# Patient Record
Sex: Male | Born: 1935 | Race: Black or African American | Hispanic: No | Smoking: Never smoker
Health system: Southern US, Community
[De-identification: ages and names within clinical notes are randomized; demographics above are authoritative.]

## PROBLEM LIST (undated history)

## (undated) DIAGNOSIS — E119 Type 2 diabetes mellitus without complications: Secondary | ICD-10-CM

## (undated) DIAGNOSIS — E785 Hyperlipidemia, unspecified: Secondary | ICD-10-CM

## (undated) DIAGNOSIS — I10 Essential (primary) hypertension: Secondary | ICD-10-CM

## (undated) DIAGNOSIS — K219 Gastro-esophageal reflux disease without esophagitis: Secondary | ICD-10-CM

---

## 2000-01-25 ENCOUNTER — Encounter: Admission: RE | Admit: 2000-01-25 | Discharge: 2000-04-24 | Payer: Self-pay | Admitting: Radiation Oncology

## 2016-08-22 ENCOUNTER — Encounter (HOSPITAL_COMMUNITY): Payer: Self-pay | Admitting: Emergency Medicine

## 2016-08-22 ENCOUNTER — Emergency Department (HOSPITAL_COMMUNITY)
Admission: EM | Admit: 2016-08-22 | Discharge: 2016-08-22 | Disposition: A | Discharge status: Home / Self Care | Payer: Medicare Other | Attending: Emergency Medicine | Admitting: Emergency Medicine

## 2016-08-22 DIAGNOSIS — N390 Urinary tract infection, site not specified: Secondary | ICD-10-CM | POA: Diagnosis not present

## 2016-08-22 DIAGNOSIS — R300 Dysuria: Secondary | ICD-10-CM | POA: Diagnosis present

## 2016-08-22 DIAGNOSIS — Z7984 Long term (current) use of oral hypoglycemic drugs: Secondary | ICD-10-CM | POA: Diagnosis not present

## 2016-08-22 DIAGNOSIS — I10 Essential (primary) hypertension: Secondary | ICD-10-CM | POA: Diagnosis not present

## 2016-08-22 DIAGNOSIS — Z79899 Other long term (current) drug therapy: Secondary | ICD-10-CM | POA: Diagnosis not present

## 2016-08-22 HISTORY — DX: Essential (primary) hypertension: I10

## 2016-08-22 LAB — URINALYSIS, ROUTINE W REFLEX MICROSCOPIC
Glucose, UA: NEGATIVE mg/dL
Ketones, ur: 15 mg/dL — AB
Nitrite: NEGATIVE
Protein, ur: 100 mg/dL — AB
Specific Gravity, Urine: >1.03 — ABNORMAL HIGH (ref 1.005–1.030)
pH: 5.5 (ref 5.0–8.0)

## 2016-08-22 LAB — CBC WITH DIFFERENTIAL/PLATELET
Basophils Absolute: 0 10*3/uL (ref 0.0–0.1)
Basophils Relative: 0 %
Eosinophils Absolute: 0 10*3/uL (ref 0.0–0.7)
Eosinophils Relative: 0 %
HCT: 29.8 % — ABNORMAL LOW (ref 39.0–52.0)
Hemoglobin: 9.8 g/dL — ABNORMAL LOW (ref 13.0–17.0)
Lymphocytes Relative: 10 %
Lymphs Abs: 1.6 10*3/uL (ref 0.7–4.0)
MCH: 26.6 pg (ref 26.0–34.0)
MCHC: 32.9 g/dL (ref 30.0–36.0)
MCV: 80.8 fL (ref 78.0–100.0)
Monocytes Absolute: 0.6 10*3/uL (ref 0.1–1.0)
Monocytes Relative: 4 %
Neutro Abs: 13.9 10*3/uL — ABNORMAL HIGH (ref 1.7–7.7)
Neutrophils Relative %: 86 %
Platelets: 161 10*3/uL (ref 150–400)
RBC: 3.69 MIL/uL — ABNORMAL LOW (ref 4.22–5.81)
RDW: 16.5 % — ABNORMAL HIGH (ref 11.5–15.5)
WBC: 16.1 10*3/uL — ABNORMAL HIGH (ref 4.0–10.5)

## 2016-08-22 LAB — URINALYSIS, MICROSCOPIC (REFLEX)

## 2016-08-22 LAB — BASIC METABOLIC PANEL
Anion gap: 14 (ref 5–15)
BUN: 46 mg/dL — ABNORMAL HIGH (ref 6–20)
CO2: 18 mmol/L — ABNORMAL LOW (ref 22–32)
Calcium: 8.8 mg/dL — ABNORMAL LOW (ref 8.9–10.3)
Chloride: 107 mmol/L (ref 101–111)
Creatinine, Ser: 2.3 mg/dL — ABNORMAL HIGH (ref 0.61–1.24)
GFR calc Af Amer: 27 mL/min — ABNORMAL LOW (ref >60)
GFR calc non Af Amer: 23 mL/min — ABNORMAL LOW (ref >60)
Glucose, Bld: 110 mg/dL — ABNORMAL HIGH (ref 65–99)
Potassium: 4.3 mmol/L (ref 3.5–5.1)
Sodium: 139 mmol/L (ref 135–145)

## 2016-08-22 MED ORDER — DEXTROSE 5 % IV SOLN
1.0000 g | Freq: Once | INTRAVENOUS | Status: AC
  Administered 2016-08-22: 1 g via INTRAVENOUS
  Filled 2016-08-22: qty 10

## 2016-08-22 MED ORDER — CEPHALEXIN 500 MG PO CAPS: 500.0000 mg | ORAL_CAPSULE | Freq: Once | ORAL | Status: DC

## 2016-08-22 MED ORDER — CEPHALEXIN 500 MG PO CAPS: 500.0000 mg | ORAL_CAPSULE | Freq: Three times a day (TID) | ORAL | 0 refills | Status: DC

## 2016-08-22 MED ORDER — SODIUM CHLORIDE 0.9 % IV BOLUS (SEPSIS)
1000.0000 mL | Freq: Once | INTRAVENOUS | Status: AC
  Administered 2016-08-22: 1000 mL via INTRAVENOUS

## 2016-08-22 NOTE — ED Triage Notes (Signed)
Pt reports dysuria and decreased urinary output for last several days. Pt denies any known fever, abd pain, n/v/d.

## 2016-08-22 NOTE — ED Provider Notes (Signed)
[facility] DEPT Provider Note   CSN: [id number] Arrival date & time: 08/22/16  1629     History   Chief Complaint Chief Complaint  Patient presents with  . Dysuria    HPI Robert Gates is a 83 y.o. male.  The history is provided by the patient.  Dysuria   This is a new problem. The current episode started more than 2 days ago. The problem occurs every urination. The problem has not changed since onset.The quality of the pain is described as burning. The pain is moderate. There has been no fever. Associated symptoms include chills, frequency, hematuria and urgency. Pertinent negatives include no sweats, no nausea, no vomiting, no discharge, no hesitancy, no possible pregnancy and no flank pain. He has tried nothing for the symptoms. His past medical history is significant for kidney stones.   83 year old male who presents with dysuria over the past 2-3 days. Has a history of nephrolithiasis. Onset of hematuria, urinary frequency, dysuria over the past few days. No associated fevers, chills, nausea or vomiting, diarrhea, abdominal pain or flank pain.   Past Medical History:  Diagnosis Date  . Hypertension     There are no active problems to display for this patient.   History reviewed. No pertinent surgical history.     Home Medications    Prior to Admission medications   Medication Sig Start Date End Date Taking? Authorizing Provider  AMLODIPINE BESYLATE PO Take 1 tablet by mouth daily.   Yes [provider]  LISINOPRIL PO Take 1 tablet by mouth daily.   Yes [provider]  metFORMIN (GLUCOPHAGE) 1000 MG tablet Take 1,000 mg by mouth 2 (two) times daily with a meal.   Yes [provider]  PRAVASTATIN SODIUM PO Take 0.5 tablets by mouth daily.   Yes [provider]  Vitamin D, Ergocalciferol, (DRISDOL) 50000 units CAPS capsule Take 50,000 Units by mouth every 7 (seven) days. wednesday   Yes [provider]  cephALEXin  (KEFLEX) 500 MG capsule Take 1 capsule (500 mg total) by mouth 3 (three) times daily. 08/22/16   Forde Dandy, MD    Family History History reviewed. No pertinent family history.  Social History Social History  Substance Use Topics  . Smoking status: Never Smoker  . Smokeless tobacco: Never Used  . Alcohol use Yes     Comment: occ     Allergies   Patient has no known allergies.   Review of Systems Review of Systems  Constitutional: Positive for chills. Negative for fever.  Gastrointestinal: Negative for nausea and vomiting.  Genitourinary: Positive for dysuria, frequency, hematuria and urgency. Negative for flank pain and hesitancy.  Musculoskeletal: Negative for back pain.  Allergic/Immunologic: Negative for immunocompromised state.  Hematological: Does not bruise/bleed easily.  Psychiatric/Behavioral: Negative for confusion.  All other systems reviewed and are negative.    Physical Exam Updated Vital Signs BP (!) 118/55   Pulse 90   Temp 98 F (36.7 C) (Oral)   Resp 18   Ht 5\' 9"  (1.753 m)   Wt 86.2 kg (190 lb)   SpO2 98%   BMI 28.06 kg/m   Physical Exam Physical Exam  Nursing note and vitals reviewed. Constitutional: Well developed, well nourished, non-toxic, and in no acute distress Head: Normocephalic and atraumatic.  Mouth/Throat: Oropharynx is clear and moist.  Neck: Normal range of motion. Neck supple.  Cardiovascular: Normal rate and regular rhythm.   Pulmonary/Chest: Effort normal and breath sounds normal.  Abdominal: Soft. There  is no tenderness. There is no rebound and no guarding.  no CVA tenderness  Musculoskeletal: Normal range of motion.  Neurological: Alert, no facial droop, fluent speech, moves all extremities symmetrically Skin: Skin is warm and dry.  Psychiatric: Cooperative   ED Treatments / Results  Labs (all labs ordered are listed, but only abnormal results are displayed) Labs Reviewed  URINALYSIS, ROUTINE W REFLEX MICROSCOPIC  - Abnormal; Notable for the following:       Result Value   APPearance HAZY (*)    Specific Gravity, Urine >1.030 (*)    Hgb urine dipstick LARGE (*)    Bilirubin Urine SMALL (*)    Ketones, ur 15 (*)    Protein, ur 100 (*)    Leukocytes, UA LARGE (*)    All other components within normal limits  CBC WITH DIFFERENTIAL/PLATELET - Abnormal; Notable for the following:    WBC 16.1 (*)    RBC 3.69 (*)    Hemoglobin 9.8 (*)    HCT 29.8 (*)    RDW 16.5 (*)    Neutro Abs 13.9 (*)    All other components within normal limits  BASIC METABOLIC PANEL - Abnormal; Notable for the following:    CO2 18 (*)    Glucose, Bld 110 (*)    BUN 46 (*)    Creatinine, Ser 2.30 (*)    Calcium 8.8 (*)    GFR calc non Af Amer 23 (*)    GFR calc Af Amer 27 (*)    All other components within normal limits  URINALYSIS, MICROSCOPIC (REFLEX) - Abnormal; Notable for the following:    Bacteria, UA MANY (*)    Squamous Epithelial / LPF 0-5 (*)    All other components within normal limits  URINE CULTURE    EKG  EKG Interpretation None       Radiology No results found.  Procedures Procedures (including critical care time)  Medications Ordered in ED Medications  cefTRIAXone (ROCEPHIN) 1 g in dextrose 5 % 50 mL IVPB (1 g Intravenous New Bag/Given 08/22/16 2023)  sodium chloride 0.9 % bolus 1,000 mL (1,000 mLs Intravenous New Bag/Given 08/22/16 2023)     Initial Impression / Assessment and Plan / ED Course  I have reviewed the triage vital signs and the nursing notes.  Pertinent labs & imaging results that were available during my care of the patient were reviewed by me and considered in my medical decision making (see chart for details).     With dysuria and urinary frequency for 2 days. Afebrile. Well appearing. Soft and benign abdomen. No CVA tenderness. UA suggestive of UTI. Clinically no signs of pyelonephritis. He does have leukocytosis of 16 and AKI creatine of 2. Given IVF and ceftriaxone  will in ED.  Given that he is well appearing, will treat as outpatient. His sons are present and they see him often to check in on him.  Discussed close PCP follow-up with recheck on creatine in 1 week. Discussed strict return instructions, including fever, confusion, vomiting, abdominal or back pain.  He expressed understanding of all discharge instructions and felt comfortable with the plan of care.   Final Clinical Impressions(s) / ED Diagnoses   Final diagnoses:  Lower urinary tract infectious disease    New Prescriptions New Prescriptions   CEPHALEXIN (KEFLEX) 500 MG CAPSULE    Take 1 capsule (500 mg total) by mouth 3 (three) times daily.     Forde Dandy, MD 08/22/16 2109

## 2016-08-22 NOTE — ED Notes (Signed)
Received report on pt, pt denies any complaints, update given, pending urine sample results from lab,

## 2016-08-22 NOTE — Discharge Instructions (Signed)
Please take antibiotics for a urinary tract infection. Drink plenty of fluids. Please see your PCP in 1 week for recheck on your kidney function. Please return without fail for worsening symptoms, including fever, vomiting, confusion, severe abdominal pain or back pain or any other symptoms concerning to you.

## 2016-08-22 NOTE — ED Notes (Signed)
Pt and family at bedside updated on plan of care,

## 2016-08-24 LAB — URINE CULTURE

## 2017-01-13 ENCOUNTER — Inpatient Hospital Stay (HOSPITAL_COMMUNITY)
Admission: EM | Admit: 2017-01-13 | Discharge: 2017-01-23 | DRG: 180 | Disposition: A | Discharge status: Skilled Nursing Facility | Payer: Medicare Other | Attending: Internal Medicine | Admitting: Internal Medicine

## 2017-01-13 ENCOUNTER — Emergency Department (HOSPITAL_COMMUNITY): Payer: Medicare Other

## 2017-01-13 ENCOUNTER — Encounter (HOSPITAL_COMMUNITY): Payer: Self-pay

## 2017-01-13 DIAGNOSIS — C3411 Malignant neoplasm of upper lobe, right bronchus or lung: Principal | ICD-10-CM | POA: Diagnosis present

## 2017-01-13 DIAGNOSIS — J9811 Atelectasis: Secondary | ICD-10-CM | POA: Diagnosis present

## 2017-01-13 DIAGNOSIS — Z87891 Personal history of nicotine dependence: Secondary | ICD-10-CM

## 2017-01-13 DIAGNOSIS — I10 Essential (primary) hypertension: Secondary | ICD-10-CM | POA: Diagnosis not present

## 2017-01-13 DIAGNOSIS — R229 Localized swelling, mass and lump, unspecified: Secondary | ICD-10-CM

## 2017-01-13 DIAGNOSIS — E119 Type 2 diabetes mellitus without complications: Secondary | ICD-10-CM | POA: Diagnosis not present

## 2017-01-13 DIAGNOSIS — IMO0002 Reserved for concepts with insufficient information to code with codable children: Secondary | ICD-10-CM

## 2017-01-13 DIAGNOSIS — E785 Hyperlipidemia, unspecified: Secondary | ICD-10-CM | POA: Diagnosis present

## 2017-01-13 DIAGNOSIS — J9600 Acute respiratory failure, unspecified whether with hypoxia or hypercapnia: Secondary | ICD-10-CM | POA: Diagnosis not present

## 2017-01-13 DIAGNOSIS — Z9889 Other specified postprocedural states: Secondary | ICD-10-CM

## 2017-01-13 DIAGNOSIS — J90 Pleural effusion, not elsewhere classified: Secondary | ICD-10-CM

## 2017-01-13 DIAGNOSIS — J9601 Acute respiratory failure with hypoxia: Secondary | ICD-10-CM | POA: Diagnosis not present

## 2017-01-13 DIAGNOSIS — R0902 Hypoxemia: Secondary | ICD-10-CM

## 2017-01-13 DIAGNOSIS — I129 Hypertensive chronic kidney disease with stage 1 through stage 4 chronic kidney disease, or unspecified chronic kidney disease: Secondary | ICD-10-CM | POA: Diagnosis present

## 2017-01-13 DIAGNOSIS — I700 Atherosclerosis of aorta: Secondary | ICD-10-CM | POA: Diagnosis present

## 2017-01-13 DIAGNOSIS — K219 Gastro-esophageal reflux disease without esophagitis: Secondary | ICD-10-CM | POA: Diagnosis present

## 2017-01-13 DIAGNOSIS — E1122 Type 2 diabetes mellitus with diabetic chronic kidney disease: Secondary | ICD-10-CM | POA: Diagnosis present

## 2017-01-13 DIAGNOSIS — K224 Dyskinesia of esophagus: Secondary | ICD-10-CM | POA: Diagnosis present

## 2017-01-13 DIAGNOSIS — Z515 Encounter for palliative care: Secondary | ICD-10-CM | POA: Diagnosis present

## 2017-01-13 DIAGNOSIS — K449 Diaphragmatic hernia without obstruction or gangrene: Secondary | ICD-10-CM | POA: Diagnosis present

## 2017-01-13 DIAGNOSIS — N182 Chronic kidney disease, stage 2 (mild): Secondary | ICD-10-CM | POA: Diagnosis present

## 2017-01-13 DIAGNOSIS — Z7984 Long term (current) use of oral hypoglycemic drugs: Secondary | ICD-10-CM

## 2017-01-13 DIAGNOSIS — J910 Malignant pleural effusion: Secondary | ICD-10-CM | POA: Diagnosis present

## 2017-01-13 DIAGNOSIS — I517 Cardiomegaly: Secondary | ICD-10-CM | POA: Diagnosis present

## 2017-01-13 DIAGNOSIS — Z66 Do not resuscitate: Secondary | ICD-10-CM | POA: Diagnosis present

## 2017-01-13 DIAGNOSIS — K5730 Diverticulosis of large intestine without perforation or abscess without bleeding: Secondary | ICD-10-CM | POA: Diagnosis present

## 2017-01-13 DIAGNOSIS — N323 Diverticulum of bladder: Secondary | ICD-10-CM | POA: Diagnosis present

## 2017-01-13 DIAGNOSIS — K8020 Calculus of gallbladder without cholecystitis without obstruction: Secondary | ICD-10-CM | POA: Diagnosis present

## 2017-01-13 DIAGNOSIS — N390 Urinary tract infection, site not specified: Secondary | ICD-10-CM | POA: Diagnosis not present

## 2017-01-13 DIAGNOSIS — I313 Pericardial effusion (noninflammatory): Secondary | ICD-10-CM | POA: Diagnosis present

## 2017-01-13 HISTORY — DX: Type 2 diabetes mellitus without complications: E11.9

## 2017-01-13 HISTORY — DX: Gastro-esophageal reflux disease without esophagitis: K21.9

## 2017-01-13 HISTORY — DX: Hyperlipidemia, unspecified: E78.5

## 2017-01-13 LAB — COMPREHENSIVE METABOLIC PANEL
ALT: 9 U/L — AB (ref 17–63)
AST: 16 U/L (ref 15–41)
Albumin: 3.5 g/dL (ref 3.5–5.0)
Alkaline Phosphatase: 65 U/L (ref 38–126)
Anion gap: 10 (ref 5–15)
BILIRUBIN TOTAL: 0.3 mg/dL (ref 0.3–1.2)
BUN: 18 mg/dL (ref 6–20)
CO2: 27 mmol/L (ref 22–32)
CREATININE: 1.18 mg/dL (ref 0.61–1.24)
Calcium: 9.2 mg/dL (ref 8.9–10.3)
Chloride: 103 mmol/L (ref 101–111)
GFR, EST NON AFRICAN AMERICAN: 52 mL/min — AB (ref >60)
Glucose, Bld: 133 mg/dL — ABNORMAL HIGH (ref 65–99)
POTASSIUM: 4.4 mmol/L (ref 3.5–5.1)
Sodium: 140 mmol/L (ref 135–145)
TOTAL PROTEIN: 8 g/dL (ref 6.5–8.1)

## 2017-01-13 LAB — CBC
HEMATOCRIT: 32.5 % — AB (ref 39.0–52.0)
Hemoglobin: 10.3 g/dL — ABNORMAL LOW (ref 13.0–17.0)
MCH: 24.5 pg — AB (ref 26.0–34.0)
MCHC: 31.7 g/dL (ref 30.0–36.0)
MCV: 77.4 fL — AB (ref 78.0–100.0)
Platelets: 264 10*3/uL (ref 150–400)
RBC: 4.2 MIL/uL — ABNORMAL LOW (ref 4.22–5.81)
RDW: 16.5 % — AB (ref 11.5–15.5)
WBC: 6.7 10*3/uL (ref 4.0–10.5)

## 2017-01-13 LAB — PROTEIN, TOTAL: Total Protein: 7.6 g/dL (ref 6.5–8.1)

## 2017-01-13 LAB — ALBUMIN: Albumin: 3.3 g/dL — ABNORMAL LOW (ref 3.5–5.0)

## 2017-01-13 LAB — LACTATE DEHYDROGENASE: LDH: 166 U/L (ref 98–192)

## 2017-01-13 LAB — TROPONIN I

## 2017-01-13 MED ORDER — ORAL CARE MOUTH RINSE
15.0000 mL | Freq: Two times a day (BID) | OROMUCOSAL | Status: DC
  Administered 2017-01-14 – 2017-01-23 (×17): 15 mL via OROMUCOSAL

## 2017-01-13 MED ORDER — POLYETHYLENE GLYCOL 3350 17 G PO PACK: 17.0000 g | PACK | Freq: Every day | ORAL | Status: DC | PRN

## 2017-01-13 MED ORDER — ONDANSETRON HCL 4 MG PO TABS: 4.0000 mg | ORAL_TABLET | Freq: Four times a day (QID) | ORAL | Status: DC | PRN

## 2017-01-13 MED ORDER — ACETAMINOPHEN 650 MG RE SUPP: 650.0000 mg | Freq: Four times a day (QID) | RECTAL | Status: DC | PRN

## 2017-01-13 MED ORDER — IPRATROPIUM-ALBUTEROL 0.5-2.5 (3) MG/3ML IN SOLN
3.0000 mL | Freq: Once | RESPIRATORY_TRACT | Status: AC
Start: 2017-01-13 — End: 2017-01-13
  Administered 2017-01-13: 3 mL via RESPIRATORY_TRACT
  Filled 2017-01-13: qty 3

## 2017-01-13 MED ORDER — ACETAMINOPHEN 325 MG PO TABS
650.0000 mg | ORAL_TABLET | Freq: Four times a day (QID) | ORAL | Status: DC | PRN
  Administered 2017-01-22: 650 mg via ORAL
  Filled 2017-01-13: qty 2

## 2017-01-13 MED ORDER — METFORMIN HCL 500 MG PO TABS
1000.0000 mg | ORAL_TABLET | Freq: Two times a day (BID) | ORAL | Status: DC
  Administered 2017-01-14: 1000 mg via ORAL
  Filled 2017-01-13: qty 2

## 2017-01-13 MED ORDER — IPRATROPIUM BROMIDE 0.02 % IN SOLN: 0.5000 mg | Freq: Once | RESPIRATORY_TRACT | Status: DC

## 2017-01-13 MED ORDER — ONDANSETRON HCL 4 MG/2ML IJ SOLN: 4.0000 mg | Freq: Four times a day (QID) | INTRAMUSCULAR | Status: DC | PRN

## 2017-01-13 MED ORDER — ALBUTEROL SULFATE (2.5 MG/3ML) 0.083% IN NEBU: 5.0000 mg | INHALATION_SOLUTION | Freq: Once | RESPIRATORY_TRACT | Status: DC

## 2017-01-13 MED ORDER — ALBUTEROL SULFATE (2.5 MG/3ML) 0.083% IN NEBU
2.5000 mg | INHALATION_SOLUTION | Freq: Once | RESPIRATORY_TRACT | Status: AC
  Administered 2017-01-13: 2.5 mg via RESPIRATORY_TRACT
  Filled 2017-01-13: qty 3

## 2017-01-13 MED ORDER — AMLODIPINE BESYLATE 5 MG PO TABS
2.5000 mg | ORAL_TABLET | Freq: Every day | ORAL | Status: DC
  Administered 2017-01-13 – 2017-01-23 (×11): 2.5 mg via ORAL
  Filled 2017-01-13 (×11): qty 1

## 2017-01-13 MED ORDER — IOPAMIDOL (ISOVUE-300) INJECTION 61%
100.0000 mL | Freq: Once | INTRAVENOUS | Status: AC | PRN
  Administered 2017-01-13: 100 mL via INTRAVENOUS

## 2017-01-13 NOTE — ED Triage Notes (Signed)
Pt brought in by EMS due to SOB for 2 weeks with worsening last night. Sats 90% upon arrival by EMS. O2 initiated w sats increase up to 97% Pt reports SOB worsening w exertion . Pt noted to be wheezing , labored . SAts 69% upon transfer and room air

## 2017-01-13 NOTE — H&P (Signed)
History and Physical    Robert Gates DDU:[id number] DOB: 03-12-1923 DOA: 01/13/2017  PCP: System, [facility] Not In [place] Patient coming from: Home  Chief Complaint: Shortness of breath  HPI: Robert Gates is a 83 y.o. male with medical history significant of diabetes mellitus, GERD, HTN, HLD that presents to ED with shortness of breath.  Symptoms began about 2 weeks ago and gradually worsened.  When symptoms began he was able to do most of his everyday activities but by day before admission he was unable to perform most of his ADL's.  No fevers, chills, chest pain, N/V/Diarrhea.  No sick contacts.  Slightly productive intermittent cough that is white.  No recent weight loss.  ED Course: Patient found to be hypoxic with minimal ambulation. Placed on 2L [place] with improvement to 94%.  CXR showing large right sided pleural effusion.  CT chest/abd/pelvis showing possible right lung mass but no other explanation for effusion.  Patient found to be anemic with H/H of 10.3/32.5 (this is actually slightly increased from 4 months ago).  Review of Systems: As per HPI otherwise 10 point review of systems negative.    Past Medical History:  Diagnosis Date  . Diabetes mellitus without complication ([place])   . GERD (gastroesophageal reflux disease)   . Hyperlipidemia   . Hypertension     History reviewed. No pertinent surgical history.   reports that he has never smoked. He has never used smokeless tobacco. He reports that he drinks alcohol. He reports that he does not use drugs.  Smoked until 1970.  Occasionally drinks alcohol- 1 drink about 1-2x/week. No Known Allergies  No family history on file.    Prior to Admission medications   Medication Sig Start Date End Date Taking? Authorizing Provider  AMLODIPINE BESYLATE PO Take 1 tablet by mouth daily.   Yes [provider]  LISINOPRIL PO Take 1 tablet by mouth daily.   Yes [provider]  metFORMIN (GLUCOPHAGE) 1000 MG tablet Take  1,000 mg by mouth 2 (two) times daily with a meal.   Yes [provider]  PRAVASTATIN SODIUM PO Take 0.5 tablets by mouth daily.   Yes [provider]  Vitamin D, Ergocalciferol, (DRISDOL) 50000 units CAPS capsule Take 50,000 Units by mouth every 7 (seven) days. wednesday   Yes [provider]  cephALEXin (KEFLEX) 500 MG capsule Take 1 capsule (500 mg total) by mouth 3 (three) times daily. Patient not taking: Reported on 01/13/2017 08/22/16   Forde Dandy, MD    Physical Exam: Vitals:   01/13/17 1100 01/13/17 1215 01/13/17 1230 01/13/17 1300  BP: (!) 144/94 (!) 173/81 (!) 168/80 (!) 157/89  Pulse: 95 (!) 108 (!) 103 (!) 106  Resp:  (!) 28 (!) 28 (!) 34  Temp:      TempSrc:      SpO2: 96% 93% 96% 96%  Weight:          Constitutional: NAD, calm, comfortable Vitals:   01/13/17 1100 01/13/17 1215 01/13/17 1230 01/13/17 1300  BP: (!) 144/94 (!) 173/81 (!) 168/80 (!) 157/89  Pulse: 95 (!) 108 (!) 103 (!) 106  Resp:  (!) 28 (!) 28 (!) 34  Temp:      TempSrc:      SpO2: 96% 93% 96% 96%  Weight:       Eyes: PERRL, lids and conjunctivae normal, arcus senilus ENMT: Mucous membranes are moist. Posterior pharynx clear of any exudate or lesions. Upper dentures present Neck: normal, supple, no  masses, no thyromegaly Respiratory: decreased breath sounds in posterior right lung field, dull to percussion on posterior right lung.  Symmetrical chest rise Cardiovascular: Regular rate and rhythm, no murmurs / rubs / gallops. No extremity edema. 2+ pedal pulses. No carotid bruits.  Abdomen: no tenderness, no masses palpated. No hepatosplenomegaly. Bowel sounds positive.  Musculoskeletal: no clubbing / cyanosis. No joint deformity upper and lower extremities. Good ROM, no contractures. Normal muscle tone.  Skin: no rashes, lesions, ulcers. No induration Neurologic: CN 2-12 grossly intact. Sensation intact, DTR normal. Strength 5/5 in all 4.  Psychiatric: Normal judgment  and insight. Alert and oriented x 3. Normal mood.   Labs on Admission: I have personally reviewed following labs and imaging studies  CBC:  Recent Labs Lab 01/13/17 0926  WBC 6.7  HGB 10.3*  HCT 32.5*  MCV 77.4*  PLT 086   Basic Metabolic Panel:  Recent Labs Lab 01/13/17 0926  NA 140  K 4.4  CL 103  CO2 27  GLUCOSE 133*  BUN 18  CREATININE 1.18  CALCIUM 9.2   GFR: Estimated Creatinine Clearance: 44.9 mL/min (by C-G formula based on SCr of 1.18 mg/dL). Liver Function Tests:  Recent Labs Lab 01/13/17 0926  AST 16  ALT 9*  ALKPHOS 65  BILITOT 0.3  PROT 8.0  ALBUMIN 3.5   No results for input(s): LIPASE, AMYLASE in the last 168 hours. No results for input(s): AMMONIA in the last 168 hours. Coagulation Profile: No results for input(s): INR, PROTIME in the last 168 hours. Cardiac Enzymes:  Recent Labs Lab 01/13/17 0926  TROPONINI <0.03   BNP (last 3 results) No results for input(s): PROBNP in the last 8760 hours. HbA1C: No results for input(s): HGBA1C in the last 72 hours. CBG: No results for input(s): GLUCAP in the last 168 hours. Lipid Profile: No results for input(s): CHOL, HDL, LDLCALC, TRIG, CHOLHDL, LDLDIRECT in the last 72 hours. Thyroid Function Tests: No results for input(s): TSH, T4TOTAL, FREET4, T3FREE, THYROIDAB in the last 72 hours. Anemia Panel: No results for input(s): VITAMINB12, FOLATE, FERRITIN, TIBC, IRON, RETICCTPCT in the last 72 hours. Urine analysis:    Component Value Date/Time   COLORURINE YELLOW 08/22/2016 1815   APPEARANCEUR HAZY (A) 08/22/2016 1815   LABSPEC >1.030 (H) 08/22/2016 1815   PHURINE 5.5 08/22/2016 1815   GLUCOSEU NEGATIVE 08/22/2016 1815   HGBUR LARGE (A) 08/22/2016 1815   BILIRUBINUR SMALL (A) 08/22/2016 1815   KETONESUR 15 (A) 08/22/2016 1815   PROTEINUR 100 (A) 08/22/2016 1815   NITRITE NEGATIVE 08/22/2016 1815   LEUKOCYTESUR LARGE (A) 08/22/2016 1815   Sepsis Labs:  !!!!!!!!!!!!!!!!!!!!!!!!!!!!!!!!!!!!!!!!!!!! @LABRCNTIP (procalcitonin:4,lacticidven:4) )No results found for this or any previous visit (from the past 240 hour(s)).   Radiological Exams on Admission: Dg Chest 2 View  Result Date: 01/13/2017 CLINICAL DATA:  Acute shortness of breath. EXAM: CHEST  2 VIEW COMPARISON:  None. FINDINGS: Complete opacification of the right hemithorax likely represents a large pleural effusion. The left lung is clear. The visualized portions of the cardiomediastinal silhouette are unremarkable. No acute bony abnormalities identified. IMPRESSION: Complete opacification the right hemithorax likely representing a large right pleural effusion. Consider chest CT with contrast for further evaluation as clinically indicated. Electronically Signed   By: Margarette Canada M.D.   On: 01/13/2017 10:01   Ct Chest W Contrast  Result Date: 01/13/2017 CLINICAL DATA:  83 year old male with shortness of breath, abdominal and pelvic pain and unintentional weight loss. Questionable history of prostate cancer. EXAM: CT CHEST, ABDOMEN,  AND PELVIS WITH CONTRAST TECHNIQUE: Multidetector CT imaging of the chest, abdomen and pelvis was performed following the standard protocol during bolus administration of intravenous contrast. CONTRAST:  188mL ISOVUE-300 IOPAMIDOL (ISOVUE-300) INJECTION 61% COMPARISON:  01/13/2017 chest radiograph today FINDINGS: CT CHEST FINDINGS Cardiovascular: Cardiomegaly and a very small pericardial effusion are noted. Thoracic aortic atherosclerotic calcifications noted without aneurysm. Mediastinum/Nodes: There are several enlarged lymph nodes in the lower right juxtapericardial/juxta diaphragmatic fat within the chest. No other definite enlarged lymph nodes are noted. Mediastinum is shifted to the left. Lungs/Pleura: A very large right pleural effusion noted with complete compressive atelectasis of the right lung. Hypodense areas within portions of the atelectatic right lung noted  which probably represent nodules/mass. These include the following (series 2): A 1.2 cm area in the right upper lobe (image 29) A 1 cm area within the right middle lobe (image 34) A 2.5 x 4 cm area/possible mass within the right lower lobe (image 37). There are small areas of probable right pleural enhancement, greatest inferiorly. The left lung is clear. Musculoskeletal: No acute or definite focal bony lesions noted. CT ABDOMEN PELVIS FINDINGS Hepatobiliary: The liver is unremarkable. Cholelithiasis identified. No biliary dilatation. Pancreas: Unremarkable Spleen: Unremarkable Adrenals/Urinary Tract: The kidneys, adrenal glands and bladder are are unremarkable except for small left bladder diverticulum and mild bilateral renal cortical thinning. Stomach/Bowel: The stomach is not well distended. No definite bowel wall thickening or inflammatory changes noted. No dilated bowel loops or evidence of bowel obstruction noted. Colonic diverticulosis noted without evidence of diverticulitis. Vascular/Lymphatic: Aortic atherosclerosis. No enlarged abdominal or pelvic lymph nodes. Reproductive: Surgical changes within the prostate identified. Other: No ascites, focal collection or pneumoperitoneum. Musculoskeletal: Lucent lesions within the L1 and L2 vertebral bodies may represent hemangiomas but or nonspecific. No acute bony abnormalities are identified. IMPRESSION: 1. Large right pleural effusion with probable focal inferior pleural enhancement, probable right lower lobe mass and other smaller right pulmonary nodules and enlarged lymph nodes in the adjacent lower right pericardial fat. These findings are highly suspicious for malignancy/metastatic disease. 2. Lucent lesions within the bodies of L1 and L2 - nonspecific but may represent hemangiomas. Consider MRI or bone scan for further evaluation. 3. Cholelithiasis 4. Cardiomegaly 5.  Aortic Atherosclerosis (ICD10-I70.0). Electronically Signed   By: Margarette Canada M.D.   On:  01/13/2017 12:23   Ct Abdomen Pelvis W Contrast  Result Date: 01/13/2017 CLINICAL DATA:  83 year old male with shortness of breath, abdominal and pelvic pain and unintentional weight loss. Questionable history of prostate cancer. EXAM: CT CHEST, ABDOMEN, AND PELVIS WITH CONTRAST TECHNIQUE: Multidetector CT imaging of the chest, abdomen and pelvis was performed following the standard protocol during bolus administration of intravenous contrast. CONTRAST:  158mL ISOVUE-300 IOPAMIDOL (ISOVUE-300) INJECTION 61% COMPARISON:  01/13/2017 chest radiograph today FINDINGS: CT CHEST FINDINGS Cardiovascular: Cardiomegaly and a very small pericardial effusion are noted. Thoracic aortic atherosclerotic calcifications noted without aneurysm. Mediastinum/Nodes: There are several enlarged lymph nodes in the lower right juxtapericardial/juxta diaphragmatic fat within the chest. No other definite enlarged lymph nodes are noted. Mediastinum is shifted to the left. Lungs/Pleura: A very large right pleural effusion noted with complete compressive atelectasis of the right lung. Hypodense areas within portions of the atelectatic right lung noted which probably represent nodules/mass. These include the following (series 2): A 1.2 cm area in the right upper lobe (image 29) A 1 cm area within the right middle lobe (image 34) A 2.5 x 4 cm area/possible mass within the right lower lobe (image  37). There are small areas of probable right pleural enhancement, greatest inferiorly. The left lung is clear. Musculoskeletal: No acute or definite focal bony lesions noted. CT ABDOMEN PELVIS FINDINGS Hepatobiliary: The liver is unremarkable. Cholelithiasis identified. No biliary dilatation. Pancreas: Unremarkable Spleen: Unremarkable Adrenals/Urinary Tract: The kidneys, adrenal glands and bladder are are unremarkable except for small left bladder diverticulum and mild bilateral renal cortical thinning. Stomach/Bowel: The stomach is not well  distended. No definite bowel wall thickening or inflammatory changes noted. No dilated bowel loops or evidence of bowel obstruction noted. Colonic diverticulosis noted without evidence of diverticulitis. Vascular/Lymphatic: Aortic atherosclerosis. No enlarged abdominal or pelvic lymph nodes. Reproductive: Surgical changes within the prostate identified. Other: No ascites, focal collection or pneumoperitoneum. Musculoskeletal: Lucent lesions within the L1 and L2 vertebral bodies may represent hemangiomas but or nonspecific. No acute bony abnormalities are identified. IMPRESSION: 1. Large right pleural effusion with probable focal inferior pleural enhancement, probable right lower lobe mass and other smaller right pulmonary nodules and enlarged lymph nodes in the adjacent lower right pericardial fat. These findings are highly suspicious for malignancy/metastatic disease. 2. Lucent lesions within the bodies of L1 and L2 - nonspecific but may represent hemangiomas. Consider MRI or bone scan for further evaluation. 3. Cholelithiasis 4. Cardiomegaly 5.  Aortic Atherosclerosis (ICD10-I70.0). Electronically Signed   By: Margarette Canada M.D.   On: 01/13/2017 12:23      Assessment/Plan Principal Problem:   Pleural effusion Active Problems:   Acute respiratory failure (HCC)   HTN (hypertension)   Type 1 diabetes mellitus without complication (HCC)    Pleural effusion - fluid analysis labs ordered - thoracentesis ordered - ? Of mass in right lung thus meaning this could be a malignant effusion - respiratory status stable on 2L [place]  Acute respiratory failure - 2/2 above - continue supplemental oxygen as needed - thoracentesis ordered - patient does not appear to be in respiratory distress with nasal cannula on  HTN - continue amlodipine and lisinopril (awaiting med rec from pharmacy)  DM Type II w/o complication - hold metformin for today   DVT prophylaxis: SCDs  Code Status: DNR  Family  Communication:  No family bedside, patient denies wanting physician to call family Disposition Plan: expect to discharge back home when improved  Consults called: none Admission status: obs, telemetry    Loretha Stapler MD [facility] Pager [phone]  If 7PM-7AM, please contact night-coverage [email or web address] Password [email or web address]  01/13/2017, 3:31 PM

## 2017-01-13 NOTE — ED Provider Notes (Signed)
[facility] EMERGENCY DEPARTMENT Provider Note   CSN: [id number] Arrival date & time: 01/13/17  0908     History   Chief Complaint Chief Complaint  Patient presents with  . Shortness of Breath    HPI Robert Gates is a 83 y.o. male.  HPI Patient is a 83 year old male who presents the emergency department with gradual progressive exertional shortness of breath and new orthopnea as well as new lower extremity swelling.  No prior history of congestive heart failure.  He previously smoked cigarettes but has not smoked in the past 30-40 years.  No history of COPD or emphysema.  Patient found to have oxygen saturations of 90% by EMS and was transferred 2 L nasal cannula.  Reports he can only walk short distances before he becomes short of breath.  He has to rest.  No chest pain.  Some productive cough.  No fevers or chills.   Past Medical History:  Diagnosis Date  . Diabetes mellitus without complication ([place])   . GERD (gastroesophageal reflux disease)   . Hyperlipidemia   . Hypertension     There are no active problems to display for this patient.   History reviewed. No pertinent surgical history.     Home Medications    Prior to Admission medications   Medication Sig Start Date End Date Taking? Authorizing Provider  AMLODIPINE BESYLATE PO Take 1 tablet by mouth daily.   Yes [provider]  LISINOPRIL PO Take 1 tablet by mouth daily.   Yes [provider]  metFORMIN (GLUCOPHAGE) 1000 MG tablet Take 1,000 mg by mouth 2 (two) times daily with a meal.   Yes [provider]  PRAVASTATIN SODIUM PO Take 0.5 tablets by mouth daily.   Yes [provider]  Vitamin D, Ergocalciferol, (DRISDOL) 50000 units CAPS capsule Take 50,000 Units by mouth every 7 (seven) days. wednesday   Yes [provider]  cephALEXin (KEFLEX) 500 MG capsule Take 1 capsule (500 mg total) by mouth 3 (three) times daily. 08/22/16   Forde Dandy, MD    Family  History No family history on file.  Social History Social History  Substance Use Topics  . Smoking status: Never Smoker  . Smokeless tobacco: Never Used  . Alcohol use Yes     Comment: occ     Allergies   Patient has no known allergies.   Review of Systems Review of Systems  All other systems reviewed and are negative.    Physical Exam Updated Vital Signs BP (!) 185/85 (BP Location: Right Arm)   Pulse (!) 119   Temp 98.1 F (36.7 C) (Oral)   Resp 20   Wt 88.5 kg (195 lb)   SpO2 93%   BMI 28.80 kg/m   Physical Exam  Constitutional: He is oriented to person, place, and time. He appears well-developed and well-nourished.  HENT:  Head: Normocephalic and atraumatic.  Eyes: EOM are normal.  Neck: Normal range of motion.  Cardiovascular: Regular rhythm, normal heart sounds and intact distal pulses.   Tachycardic  Pulmonary/Chest: Effort normal. No respiratory distress. He has wheezes.  Abdominal: Soft. He exhibits no distension. There is no tenderness.  Musculoskeletal: Normal range of motion.  Neurological: He is alert and oriented to person, place, and time.  Skin: Skin is warm and dry.  Psychiatric: He has a normal mood and affect. Judgment normal.  Nursing note and vitals reviewed.    ED Treatments / Results  Labs (all labs ordered are listed,  but only abnormal results are displayed) Labs Reviewed  CBC - Abnormal; Notable for the following:       Result Value   RBC 4.20 (*)    Hemoglobin 10.3 (*)    HCT 32.5 (*)    MCV 77.4 (*)    MCH 24.5 (*)    RDW 16.5 (*)    All other components within normal limits  COMPREHENSIVE METABOLIC PANEL - Abnormal; Notable for the following:    Glucose, Bld 133 (*)    ALT 9 (*)    GFR calc non Af Amer 52 (*)    All other components within normal limits  TROPONIN I    EKG ECG interpretation   Date: 01/13/2017  Rate: 114  Rhythm: sinus tachycardia  QRS Axis: normal  Intervals: normal  ST/T Wave abnormalities:  normal  Conduction Disutrbances: LBBB  Narrative Interpretation:   Old EKG Reviewed: No significant changes noted     Radiology Dg Chest 2 View  Result Date: 01/13/2017 CLINICAL DATA:  Acute shortness of breath. EXAM: CHEST  2 VIEW COMPARISON:  None. FINDINGS: Complete opacification of the right hemithorax likely represents a large pleural effusion. The left lung is clear. The visualized portions of the cardiomediastinal silhouette are unremarkable. No acute bony abnormalities identified. IMPRESSION: Complete opacification the right hemithorax likely representing a large right pleural effusion. Consider chest CT with contrast for further evaluation as clinically indicated. Electronically Signed   By: Margarette Canada M.D.   On: 01/13/2017 10:01   Ct Chest W Contrast  Result Date: 01/13/2017 CLINICAL DATA:  83 year old male with shortness of breath, abdominal and pelvic pain and unintentional weight loss. Questionable history of prostate cancer. EXAM: CT CHEST, ABDOMEN, AND PELVIS WITH CONTRAST TECHNIQUE: Multidetector CT imaging of the chest, abdomen and pelvis was performed following the standard protocol during bolus administration of intravenous contrast. CONTRAST:  151mL ISOVUE-300 IOPAMIDOL (ISOVUE-300) INJECTION 61% COMPARISON:  01/13/2017 chest radiograph today FINDINGS: CT CHEST FINDINGS Cardiovascular: Cardiomegaly and a very small pericardial effusion are noted. Thoracic aortic atherosclerotic calcifications noted without aneurysm. Mediastinum/Nodes: There are several enlarged lymph nodes in the lower right juxtapericardial/juxta diaphragmatic fat within the chest. No other definite enlarged lymph nodes are noted. Mediastinum is shifted to the left. Lungs/Pleura: A very large right pleural effusion noted with complete compressive atelectasis of the right lung. Hypodense areas within portions of the atelectatic right lung noted which probably represent nodules/mass. These include the following  (series 2): A 1.2 cm area in the right upper lobe (image 29) A 1 cm area within the right middle lobe (image 34) A 2.5 x 4 cm area/possible mass within the right lower lobe (image 37). There are small areas of probable right pleural enhancement, greatest inferiorly. The left lung is clear. Musculoskeletal: No acute or definite focal bony lesions noted. CT ABDOMEN PELVIS FINDINGS Hepatobiliary: The liver is unremarkable. Cholelithiasis identified. No biliary dilatation. Pancreas: Unremarkable Spleen: Unremarkable Adrenals/Urinary Tract: The kidneys, adrenal glands and bladder are are unremarkable except for small left bladder diverticulum and mild bilateral renal cortical thinning. Stomach/Bowel: The stomach is not well distended. No definite bowel wall thickening or inflammatory changes noted. No dilated bowel loops or evidence of bowel obstruction noted. Colonic diverticulosis noted without evidence of diverticulitis. Vascular/Lymphatic: Aortic atherosclerosis. No enlarged abdominal or pelvic lymph nodes. Reproductive: Surgical changes within the prostate identified. Other: No ascites, focal collection or pneumoperitoneum. Musculoskeletal: Lucent lesions within the L1 and L2 vertebral bodies may represent hemangiomas but or nonspecific. No acute bony  abnormalities are identified. IMPRESSION: 1. Large right pleural effusion with probable focal inferior pleural enhancement, probable right lower lobe mass and other smaller right pulmonary nodules and enlarged lymph nodes in the adjacent lower right pericardial fat. These findings are highly suspicious for malignancy/metastatic disease. 2. Lucent lesions within the bodies of L1 and L2 - nonspecific but may represent hemangiomas. Consider MRI or bone scan for further evaluation. 3. Cholelithiasis 4. Cardiomegaly 5.  Aortic Atherosclerosis (ICD10-I70.0). Electronically Signed   By: Margarette Canada M.D.   On: 01/13/2017 12:23   Ct Abdomen Pelvis W Contrast  Result Date:  01/13/2017 CLINICAL DATA:  83 year old male with shortness of breath, abdominal and pelvic pain and unintentional weight loss. Questionable history of prostate cancer. EXAM: CT CHEST, ABDOMEN, AND PELVIS WITH CONTRAST TECHNIQUE: Multidetector CT imaging of the chest, abdomen and pelvis was performed following the standard protocol during bolus administration of intravenous contrast. CONTRAST:  186mL ISOVUE-300 IOPAMIDOL (ISOVUE-300) INJECTION 61% COMPARISON:  01/13/2017 chest radiograph today FINDINGS: CT CHEST FINDINGS Cardiovascular: Cardiomegaly and a very small pericardial effusion are noted. Thoracic aortic atherosclerotic calcifications noted without aneurysm. Mediastinum/Nodes: There are several enlarged lymph nodes in the lower right juxtapericardial/juxta diaphragmatic fat within the chest. No other definite enlarged lymph nodes are noted. Mediastinum is shifted to the left. Lungs/Pleura: A very large right pleural effusion noted with complete compressive atelectasis of the right lung. Hypodense areas within portions of the atelectatic right lung noted which probably represent nodules/mass. These include the following (series 2): A 1.2 cm area in the right upper lobe (image 29) A 1 cm area within the right middle lobe (image 34) A 2.5 x 4 cm area/possible mass within the right lower lobe (image 37). There are small areas of probable right pleural enhancement, greatest inferiorly. The left lung is clear. Musculoskeletal: No acute or definite focal bony lesions noted. CT ABDOMEN PELVIS FINDINGS Hepatobiliary: The liver is unremarkable. Cholelithiasis identified. No biliary dilatation. Pancreas: Unremarkable Spleen: Unremarkable Adrenals/Urinary Tract: The kidneys, adrenal glands and bladder are are unremarkable except for small left bladder diverticulum and mild bilateral renal cortical thinning. Stomach/Bowel: The stomach is not well distended. No definite bowel wall thickening or inflammatory changes  noted. No dilated bowel loops or evidence of bowel obstruction noted. Colonic diverticulosis noted without evidence of diverticulitis. Vascular/Lymphatic: Aortic atherosclerosis. No enlarged abdominal or pelvic lymph nodes. Reproductive: Surgical changes within the prostate identified. Other: No ascites, focal collection or pneumoperitoneum. Musculoskeletal: Lucent lesions within the L1 and L2 vertebral bodies may represent hemangiomas but or nonspecific. No acute bony abnormalities are identified. IMPRESSION: 1. Large right pleural effusion with probable focal inferior pleural enhancement, probable right lower lobe mass and other smaller right pulmonary nodules and enlarged lymph nodes in the adjacent lower right pericardial fat. These findings are highly suspicious for malignancy/metastatic disease. 2. Lucent lesions within the bodies of L1 and L2 - nonspecific but may represent hemangiomas. Consider MRI or bone scan for further evaluation. 3. Cholelithiasis 4. Cardiomegaly 5.  Aortic Atherosclerosis (ICD10-I70.0). Electronically Signed   By: Margarette Canada M.D.   On: 01/13/2017 12:23    Procedures Procedures (including critical care time)  Medications Ordered in ED Medications  ipratropium-albuterol (DUONEB) 0.5-2.5 (3) MG/3ML nebulizer solution 3 mL (3 mLs Nebulization Given 01/13/17 1010)  albuterol (PROVENTIL) (2.5 MG/3ML) 0.083% nebulizer solution 2.5 mg (2.5 mg Nebulization Given 01/13/17 1010)  iopamidol (ISOVUE-300) 61 % injection 100 mL (100 mLs Intravenous Contrast Given 01/13/17 1127)     Initial Impression / Assessment and  Plan / ED Course  I have reviewed the triage vital signs and the nursing notes.  Pertinent labs & imaging results that were available during my care of the patient were reviewed by me and considered in my medical decision making (see chart for details).     Large right-sided pleural effusion likely leading to exertional shortness of breath and hypoxia.  No obvious  pathology found in his abdomen and pelvis.  Questionable right lower lobe mass.  Patient will need additional workup once thoracentesis is performed.  Patient will be admitted to the hospitalist service.  Patient and family updated.   Final Clinical Impressions(s) / ED Diagnoses   Final diagnoses:  Hypoxia  Pleural effusion on right    New Prescriptions New Prescriptions   No medications on file     Jola Schmidt, MD 01/13/17 1246

## 2017-01-14 ENCOUNTER — Observation Stay (HOSPITAL_COMMUNITY): Payer: Medicare Other

## 2017-01-14 ENCOUNTER — Encounter (HOSPITAL_COMMUNITY): Payer: Self-pay

## 2017-01-14 DIAGNOSIS — Z9889 Other specified postprocedural states: Secondary | ICD-10-CM | POA: Diagnosis not present

## 2017-01-14 DIAGNOSIS — K219 Gastro-esophageal reflux disease without esophagitis: Secondary | ICD-10-CM | POA: Diagnosis present

## 2017-01-14 DIAGNOSIS — E1122 Type 2 diabetes mellitus with diabetic chronic kidney disease: Secondary | ICD-10-CM | POA: Diagnosis present

## 2017-01-14 DIAGNOSIS — K449 Diaphragmatic hernia without obstruction or gangrene: Secondary | ICD-10-CM | POA: Diagnosis present

## 2017-01-14 DIAGNOSIS — K8020 Calculus of gallbladder without cholecystitis without obstruction: Secondary | ICD-10-CM | POA: Diagnosis present

## 2017-01-14 DIAGNOSIS — K5730 Diverticulosis of large intestine without perforation or abscess without bleeding: Secondary | ICD-10-CM | POA: Diagnosis present

## 2017-01-14 DIAGNOSIS — J9600 Acute respiratory failure, unspecified whether with hypoxia or hypercapnia: Secondary | ICD-10-CM | POA: Diagnosis not present

## 2017-01-14 DIAGNOSIS — Z7984 Long term (current) use of oral hypoglycemic drugs: Secondary | ICD-10-CM | POA: Diagnosis not present

## 2017-01-14 DIAGNOSIS — E119 Type 2 diabetes mellitus without complications: Secondary | ICD-10-CM | POA: Diagnosis not present

## 2017-01-14 DIAGNOSIS — R229 Localized swelling, mass and lump, unspecified: Secondary | ICD-10-CM | POA: Diagnosis not present

## 2017-01-14 DIAGNOSIS — N390 Urinary tract infection, site not specified: Secondary | ICD-10-CM | POA: Diagnosis not present

## 2017-01-14 DIAGNOSIS — Z66 Do not resuscitate: Secondary | ICD-10-CM | POA: Diagnosis present

## 2017-01-14 DIAGNOSIS — E785 Hyperlipidemia, unspecified: Secondary | ICD-10-CM | POA: Diagnosis present

## 2017-01-14 DIAGNOSIS — R0902 Hypoxemia: Secondary | ICD-10-CM | POA: Diagnosis present

## 2017-01-14 DIAGNOSIS — I10 Essential (primary) hypertension: Secondary | ICD-10-CM | POA: Diagnosis not present

## 2017-01-14 DIAGNOSIS — K224 Dyskinesia of esophagus: Secondary | ICD-10-CM | POA: Diagnosis present

## 2017-01-14 DIAGNOSIS — J90 Pleural effusion, not elsewhere classified: Secondary | ICD-10-CM | POA: Diagnosis not present

## 2017-01-14 DIAGNOSIS — J9811 Atelectasis: Secondary | ICD-10-CM | POA: Diagnosis present

## 2017-01-14 DIAGNOSIS — N182 Chronic kidney disease, stage 2 (mild): Secondary | ICD-10-CM | POA: Diagnosis present

## 2017-01-14 DIAGNOSIS — Z515 Encounter for palliative care: Secondary | ICD-10-CM | POA: Diagnosis present

## 2017-01-14 DIAGNOSIS — J910 Malignant pleural effusion: Secondary | ICD-10-CM | POA: Diagnosis present

## 2017-01-14 DIAGNOSIS — C3411 Malignant neoplasm of upper lobe, right bronchus or lung: Secondary | ICD-10-CM | POA: Diagnosis present

## 2017-01-14 DIAGNOSIS — I129 Hypertensive chronic kidney disease with stage 1 through stage 4 chronic kidney disease, or unspecified chronic kidney disease: Secondary | ICD-10-CM | POA: Diagnosis present

## 2017-01-14 DIAGNOSIS — J9601 Acute respiratory failure with hypoxia: Secondary | ICD-10-CM | POA: Diagnosis present

## 2017-01-14 DIAGNOSIS — N323 Diverticulum of bladder: Secondary | ICD-10-CM | POA: Diagnosis present

## 2017-01-14 DIAGNOSIS — I313 Pericardial effusion (noninflammatory): Secondary | ICD-10-CM | POA: Diagnosis present

## 2017-01-14 DIAGNOSIS — I517 Cardiomegaly: Secondary | ICD-10-CM | POA: Diagnosis present

## 2017-01-14 DIAGNOSIS — I700 Atherosclerosis of aorta: Secondary | ICD-10-CM | POA: Diagnosis present

## 2017-01-14 DIAGNOSIS — Z87891 Personal history of nicotine dependence: Secondary | ICD-10-CM | POA: Diagnosis not present

## 2017-01-14 LAB — GLUCOSE, CAPILLARY
GLUCOSE-CAPILLARY: 70 mg/dL (ref 65–99)
Glucose-Capillary: 76 mg/dL (ref 65–99)

## 2017-01-14 LAB — BODY FLUID CELL COUNT WITH DIFFERENTIAL
Eos, Fluid: 0 %
LYMPHS FL: 30 %
MONOCYTE-MACROPHAGE-SEROUS FLUID: 67 % (ref 50–90)
NEUTROPHIL FLUID: 3 % (ref 0–25)
Other Cells, Fluid: 0 %
WBC FLUID: 279 uL (ref 0–1000)

## 2017-01-14 LAB — GLUCOSE, PLEURAL OR PERITONEAL FLUID: GLUCOSE FL: 100 mg/dL

## 2017-01-14 LAB — LACTATE DEHYDROGENASE, PLEURAL OR PERITONEAL FLUID: LD FL: 280 U/L — AB (ref 3–23)

## 2017-01-14 LAB — PROTEIN, PLEURAL OR PERITONEAL FLUID: Total protein, fluid: 5.4 g/dL

## 2017-01-14 LAB — ALBUMIN, PLEURAL OR PERITONEAL FLUID: Albumin, Fluid: 2.7 g/dL

## 2017-01-14 LAB — GRAM STAIN

## 2017-01-14 MED ORDER — ALBUTEROL SULFATE (2.5 MG/3ML) 0.083% IN NEBU
2.5000 mg | INHALATION_SOLUTION | Freq: Four times a day (QID) | RESPIRATORY_TRACT | Status: DC | PRN
  Administered 2017-01-14: 2.5 mg via RESPIRATORY_TRACT
  Filled 2017-01-14: qty 3

## 2017-01-14 MED ORDER — INSULIN ASPART 100 UNIT/ML ~~LOC~~ SOLN
0.0000 [IU] | Freq: Three times a day (TID) | SUBCUTANEOUS | Status: DC
  Administered 2017-01-15 – 2017-01-16 (×2): 1 [IU] via SUBCUTANEOUS
  Administered 2017-01-16: 2 [IU] via SUBCUTANEOUS
  Administered 2017-01-16 – 2017-01-18 (×3): 1 [IU] via SUBCUTANEOUS
  Administered 2017-01-18: 2 [IU] via SUBCUTANEOUS
  Administered 2017-01-19: 3 [IU] via SUBCUTANEOUS
  Administered 2017-01-20 – 2017-01-21 (×3): 2 [IU] via SUBCUTANEOUS
  Administered 2017-01-22 – 2017-01-23 (×4): 1 [IU] via SUBCUTANEOUS
  Administered 2017-01-23: 2 [IU] via SUBCUTANEOUS

## 2017-01-14 NOTE — Procedures (Signed)
PreOperative Dx: RT pleural effusion Postoperative Dx: RT pleural effusion Procedure:   US guided RT thoracentesis Radiologist:  Thornton Papas Anesthesia:  9 ml of 1% lidocaine Specimen:  1.5 L of yellow colored fluid EBL:   < 1 ml Complications: None

## 2017-01-14 NOTE — Progress Notes (Signed)
Thoracentesis complete no signs of distress, 1.5L yellow colored pleural fluid removed.

## 2017-01-14 NOTE — Progress Notes (Signed)
PROGRESS NOTE    Robert Gates  XNA:[id number] DOB: 11/12/1922 DOA: 01/13/2017 PCP: System, Pcp Not In    Brief Narrative:  Robert Gates is a 83 y.o. male with medical history significant of diabetes mellitus, GERD, HTN, HLD that presents to ED with shortness of breath.  Symptoms began about 2 weeks ago and gradually worsened.  When symptoms began he was able to do most of his everyday activities but by day before admission he was unable to perform most of his ADL's.  No fevers, chills, chest pain, N/V/Diarrhea.  No sick contacts.  Slightly productive intermittent cough that is productive of white sputum.  No recent weight loss.  ED Course: Patient found to be hypoxic with minimal ambulation. Placed on 2L [place] with improvement to 94%.  CXR showing large right sided pleural effusion.  CT chest/abd/pelvis showing possible right lung mass but no other explanation for effusion.  Patient found to be anemic with H/H of 10.3/32.5 (this is actually slightly increased from 4 months ago).   Assessment & Plan:   Principal Problem:   Pleural effusion Active Problems:   Acute respiratory failure (HCC)   HTN (hypertension)   DM II (diabetes mellitus, type II), controlled (HCC)   Pleural effusion - fluid analysis labs ordered - thoracentesis performed on 10/22 (1.5L removed) - ? Of mass in right lung thus meaning this could be a malignant effusion - monitor respiratory status - repeat CXR in am to ensure no reaccumulation  Acute respiratory failure - 2/2 above - continue supplemental oxygen as needed - thoracentesis performed today  HTN - continue amlodipine - will restart lisinopril  DM Type II w/o complication - ssi   DVT prophylaxis: SCDs  Code Status: DNR  Family Communication:  No family bedside, patient denies wanting physician to call family Disposition Plan: expect to discharge back home when improved; likely tomorrow after repeat CXR  Consultants:   None  Procedures:     Thoracentesis 10/22  Antimicrobials:   none    Subjective: Patient seen prior to thoracentesis.  He is without supplemental oxygen but still feels short of breath.    Objective: Vitals:   01/14/17 0743 01/14/17 0830 01/14/17 1251 01/14/17 1327  BP: (!) 142/78  (!) 153/72 (!) 155/59  Pulse: 83  88 88  Resp: 20  20 20   Temp: 98.5 F (36.9 C)     TempSrc: Oral     SpO2: 97% 96% (!) 86% (!) 88%  Weight:      Height:        Intake/Output Summary (Last 24 hours) at 01/14/17 1356 Last data filed at 01/14/17 0800  Gross per 24 hour  Intake                0 ml  Output             1050 ml  Net            -1050 ml   Filed Weights   01/13/17 0904 01/13/17 1627  Weight: 88.5 kg (195 lb) 88.5 kg (195 lb)    Examination:  General exam: Appears calm and comfortable  Respiratory system: decreased breath sounds on right side throughout all lung fields Cardiovascular system: S1 & S2 heard, RRR. No JVD, murmurs, rubs, gallops or clicks. No pedal edema. Gastrointestinal system: Abdomen is nondistended, soft and nontender. No organomegaly or masses felt. Normal bowel sounds heard. Central nervous system: Alert and oriented. No focal neurological deficits. Extremities: Symmetric 5 x 5 power.  Skin: No rashes, lesions or ulcers Psychiatry: Judgement and insight appear normal. Mood & affect appropriate.     Data Reviewed: I have personally reviewed following labs and imaging studies  CBC:  Recent Labs Lab 01/13/17 0926  WBC 6.7  HGB 10.3*  HCT 32.5*  MCV 77.4*  PLT 102   Basic Metabolic Panel:  Recent Labs Lab 01/13/17 0926  NA 140  K 4.4  CL 103  CO2 27  GLUCOSE 133*  BUN 18  CREATININE 1.18  CALCIUM 9.2   GFR: Estimated Creatinine Clearance: 45.3 mL/min (by C-G formula based on SCr of 1.18 mg/dL). Liver Function Tests:  Recent Labs Lab 01/13/17 0926 01/13/17 1639  AST 16  --   ALT 9*  --   ALKPHOS 65  --   BILITOT 0.3  --   PROT 8.0 7.6  ALBUMIN  3.5 3.3*   No results for input(s): LIPASE, AMYLASE in the last 168 hours. No results for input(s): AMMONIA in the last 168 hours. Coagulation Profile: No results for input(s): INR, PROTIME in the last 168 hours. Cardiac Enzymes:  Recent Labs Lab 01/13/17 0926  TROPONINI <0.03   BNP (last 3 results) No results for input(s): PROBNP in the last 8760 hours. HbA1C: No results for input(s): HGBA1C in the last 72 hours. CBG: No results for input(s): GLUCAP in the last 168 hours. Lipid Profile: No results for input(s): CHOL, HDL, LDLCALC, TRIG, CHOLHDL, LDLDIRECT in the last 72 hours. Thyroid Function Tests: No results for input(s): TSH, T4TOTAL, FREET4, T3FREE, THYROIDAB in the last 72 hours. Anemia Panel: No results for input(s): VITAMINB12, FOLATE, FERRITIN, TIBC, IRON, RETICCTPCT in the last 72 hours. Sepsis Labs: No results for input(s): PROCALCITON, LATICACIDVEN in the last 168 hours.  No results found for this or any previous visit (from the past 240 hour(s)).       Radiology Studies: Dg Chest 2 View  Result Date: 01/13/2017 CLINICAL DATA:  Acute shortness of breath. EXAM: CHEST  2 VIEW COMPARISON:  None. FINDINGS: Complete opacification of the right hemithorax likely represents a large pleural effusion. The left lung is clear. The visualized portions of the cardiomediastinal silhouette are unremarkable. No acute bony abnormalities identified. IMPRESSION: Complete opacification the right hemithorax likely representing a large right pleural effusion. Consider chest CT with contrast for further evaluation as clinically indicated. Electronically Signed   By: Margarette Canada M.D.   On: 01/13/2017 10:01   Ct Chest W Contrast  Result Date: 01/13/2017 CLINICAL DATA:  83 year old male with shortness of breath, abdominal and pelvic pain and unintentional weight loss. Questionable history of prostate cancer. EXAM: CT CHEST, ABDOMEN, AND PELVIS WITH CONTRAST TECHNIQUE: Multidetector CT  imaging of the chest, abdomen and pelvis was performed following the standard protocol during bolus administration of intravenous contrast. CONTRAST:  124mL ISOVUE-300 IOPAMIDOL (ISOVUE-300) INJECTION 61% COMPARISON:  01/13/2017 chest radiograph today FINDINGS: CT CHEST FINDINGS Cardiovascular: Cardiomegaly and a very small pericardial effusion are noted. Thoracic aortic atherosclerotic calcifications noted without aneurysm. Mediastinum/Nodes: There are several enlarged lymph nodes in the lower right juxtapericardial/juxta diaphragmatic fat within the chest. No other definite enlarged lymph nodes are noted. Mediastinum is shifted to the left. Lungs/Pleura: A very large right pleural effusion noted with complete compressive atelectasis of the right lung. Hypodense areas within portions of the atelectatic right lung noted which probably represent nodules/mass. These include the following (series 2): A 1.2 cm area in the right upper lobe (image 29) A 1 cm area within the right middle lobe (  image 34) A 2.5 x 4 cm area/possible mass within the right lower lobe (image 37). There are small areas of probable right pleural enhancement, greatest inferiorly. The left lung is clear. Musculoskeletal: No acute or definite focal bony lesions noted. CT ABDOMEN PELVIS FINDINGS Hepatobiliary: The liver is unremarkable. Cholelithiasis identified. No biliary dilatation. Pancreas: Unremarkable Spleen: Unremarkable Adrenals/Urinary Tract: The kidneys, adrenal glands and bladder are are unremarkable except for small left bladder diverticulum and mild bilateral renal cortical thinning. Stomach/Bowel: The stomach is not well distended. No definite bowel wall thickening or inflammatory changes noted. No dilated bowel loops or evidence of bowel obstruction noted. Colonic diverticulosis noted without evidence of diverticulitis. Vascular/Lymphatic: Aortic atherosclerosis. No enlarged abdominal or pelvic lymph nodes. Reproductive: Surgical  changes within the prostate identified. Other: No ascites, focal collection or pneumoperitoneum. Musculoskeletal: Lucent lesions within the L1 and L2 vertebral bodies may represent hemangiomas but or nonspecific. No acute bony abnormalities are identified. IMPRESSION: 1. Large right pleural effusion with probable focal inferior pleural enhancement, probable right lower lobe mass and other smaller right pulmonary nodules and enlarged lymph nodes in the adjacent lower right pericardial fat. These findings are highly suspicious for malignancy/metastatic disease. 2. Lucent lesions within the bodies of L1 and L2 - nonspecific but may represent hemangiomas. Consider MRI or bone scan for further evaluation. 3. Cholelithiasis 4. Cardiomegaly 5.  Aortic Atherosclerosis (ICD10-I70.0). Electronically Signed   By: Margarette Canada M.D.   On: 01/13/2017 12:23   Ct Abdomen Pelvis W Contrast  Result Date: 01/13/2017 CLINICAL DATA:  83 year old male with shortness of breath, abdominal and pelvic pain and unintentional weight loss. Questionable history of prostate cancer. EXAM: CT CHEST, ABDOMEN, AND PELVIS WITH CONTRAST TECHNIQUE: Multidetector CT imaging of the chest, abdomen and pelvis was performed following the standard protocol during bolus administration of intravenous contrast. CONTRAST:  134mL ISOVUE-300 IOPAMIDOL (ISOVUE-300) INJECTION 61% COMPARISON:  01/13/2017 chest radiograph today FINDINGS: CT CHEST FINDINGS Cardiovascular: Cardiomegaly and a very small pericardial effusion are noted. Thoracic aortic atherosclerotic calcifications noted without aneurysm. Mediastinum/Nodes: There are several enlarged lymph nodes in the lower right juxtapericardial/juxta diaphragmatic fat within the chest. No other definite enlarged lymph nodes are noted. Mediastinum is shifted to the left. Lungs/Pleura: A very large right pleural effusion noted with complete compressive atelectasis of the right lung. Hypodense areas within portions of  the atelectatic right lung noted which probably represent nodules/mass. These include the following (series 2): A 1.2 cm area in the right upper lobe (image 29) A 1 cm area within the right middle lobe (image 34) A 2.5 x 4 cm area/possible mass within the right lower lobe (image 37). There are small areas of probable right pleural enhancement, greatest inferiorly. The left lung is clear. Musculoskeletal: No acute or definite focal bony lesions noted. CT ABDOMEN PELVIS FINDINGS Hepatobiliary: The liver is unremarkable. Cholelithiasis identified. No biliary dilatation. Pancreas: Unremarkable Spleen: Unremarkable Adrenals/Urinary Tract: The kidneys, adrenal glands and bladder are are unremarkable except for small left bladder diverticulum and mild bilateral renal cortical thinning. Stomach/Bowel: The stomach is not well distended. No definite bowel wall thickening or inflammatory changes noted. No dilated bowel loops or evidence of bowel obstruction noted. Colonic diverticulosis noted without evidence of diverticulitis. Vascular/Lymphatic: Aortic atherosclerosis. No enlarged abdominal or pelvic lymph nodes. Reproductive: Surgical changes within the prostate identified. Other: No ascites, focal collection or pneumoperitoneum. Musculoskeletal: Lucent lesions within the L1 and L2 vertebral bodies may represent hemangiomas but or nonspecific. No acute bony abnormalities are identified. IMPRESSION: 1.  Large right pleural effusion with probable focal inferior pleural enhancement, probable right lower lobe mass and other smaller right pulmonary nodules and enlarged lymph nodes in the adjacent lower right pericardial fat. These findings are highly suspicious for malignancy/metastatic disease. 2. Lucent lesions within the bodies of L1 and L2 - nonspecific but may represent hemangiomas. Consider MRI or bone scan for further evaluation. 3. Cholelithiasis 4. Cardiomegaly 5.  Aortic Atherosclerosis (ICD10-I70.0). Electronically  Signed   By: Margarette Canada M.D.   On: 01/13/2017 12:23        Scheduled Meds: . amLODipine  2.5 mg Oral Daily  . mouth rinse  15 mL Mouth Rinse BID  . metFORMIN  1,000 mg Oral BID WC   Continuous Infusions:   LOS: 0 days    Time spent: 25 minutes    Loretha Stapler, MD [facility] Pager [phone]  If 7PM-7AM, please contact night-coverage [email or web address] Password [email or web address] 01/14/2017, 1:56 PM

## 2017-01-14 NOTE — Care Management Note (Signed)
Case Management Note  Patient Details  Name: Robert Gates MRN: [id number] Date of Birth: 1922/09/27  Subjective/Objective:                 Admitted with pleural effusion, undiagnosed met disease expected. Thoracentesis today. Pt seen to administer MOON. Pt from home, lives alone, very ind, drives, ambulates with cane, has RW if needed. Pt has family that lives nearby and could help if needed.   Action/Plan: DC home with self care. CM will cont to follow.   Expected Discharge Date:    01/15/2017              Expected Discharge Plan:  Home/Self Care  In-House Referral:  NA  Discharge planning Services  CM Consult  Post Acute Care Choice:  NA Choice offered to:  NA  Status of Service:  In process, will continue to follow  Sherald Barge, RN 01/14/2017, 9:59 AM

## 2017-01-14 NOTE — Care Management Obs Status (Signed)
[facility] NOTIFICATION   Patient Details  Name: Delorean Knutzen MRN: [id number] Date of Birth: [birth year]   Medicare Observation Status Notification Given:  Yes    Sherald Barge, RN 01/14/2017, 10:24 AM

## 2017-01-15 ENCOUNTER — Inpatient Hospital Stay (HOSPITAL_COMMUNITY): Payer: Medicare Other

## 2017-01-15 DIAGNOSIS — J9601 Acute respiratory failure with hypoxia: Secondary | ICD-10-CM

## 2017-01-15 LAB — GLUCOSE, CAPILLARY
GLUCOSE-CAPILLARY: 185 mg/dL — AB (ref 65–99)
GLUCOSE-CAPILLARY: 122 mg/dL — AB (ref 65–99)
GLUCOSE-CAPILLARY: 153 mg/dL — AB (ref 65–99)
Glucose-Capillary: 75 mg/dL (ref 65–99)
Glucose-Capillary: 65 mg/dL (ref 65–99)

## 2017-01-15 LAB — ACID FAST SMEAR (AFB): ACID FAST SMEAR - AFSCU2: NEGATIVE

## 2017-01-15 LAB — ACID FAST SMEAR (AFB, MYCOBACTERIA)

## 2017-01-15 MED ORDER — LISINOPRIL 5 MG PO TABS
5.0000 mg | ORAL_TABLET | Freq: Every day | ORAL | Status: DC
  Administered 2017-01-15 – 2017-01-23 (×8): 5 mg via ORAL
  Filled 2017-01-15 (×9): qty 1

## 2017-01-15 NOTE — Progress Notes (Signed)
Patient Son called with clarification of dosage of medication patient is taking. Pravastatin 40mg , lisinopril 40mg , amlodipine 10mg 

## 2017-01-15 NOTE — Progress Notes (Signed)
PROGRESS NOTE    Robert Gates  VPX:[id number] DOB: 03-13-1923 DOA: 01/13/2017 PCP: System, Pcp Not In    Brief Narrative:  Robert Gates is a 83 y.o. male with medical history significant of diabetes mellitus, GERD, HTN, HLD that presents to ED with shortness of breath.  Symptoms began about 2 weeks ago and gradually worsened.  When symptoms began he was able to do most of his everyday activities but by day before admission he was unable to perform most of his ADL's.  No fevers, chills, chest pain, N/V/Diarrhea.  No sick contacts.  Slightly productive intermittent cough that is productive of white sputum.  No recent weight loss.  ED Course: Patient found to be hypoxic with minimal ambulation. Placed on 2L [place] with improvement to 94%.  CXR showing large right sided pleural effusion.  CT chest/abd/pelvis showing possible right lung mass but no other explanation for effusion.  Patient found to be anemic with H/H of 10.3/32.5 (this is actually slightly increased from 4 months ago).   Assessment & Plan:   Principal Problem:   Pleural effusion on right Active Problems:   Acute respiratory failure (HCC)   HTN (hypertension)   DM II (diabetes mellitus, type II), controlled (HCC)   Status post thoracentesis   Pleural effusion - fluid analysis labs ordered - thoracentesis performed on 10/22 (1.5L removed) - ? Of mass in right lung thus meaning this could be a malignant effusion - monitor respiratory status - 2/2 minimally improved respiratory exam CT of chest performed- significant right pleural effusion slightly improved after thoracentesis yesterday - repeat thoracentesis ordered - pulmonology consulted  Acute respiratory failure - 2/2 above - continue supplemental oxygen as needed - thoracentesis performed yesterday - repeat ordered for tomorrow  HTN - continue amlodipine - restart lisinopril  DM Type II w/o complication - ssi   DVT prophylaxis: SCDs  Code Status: DNR    Family Communication:  patient's son is bedside Disposition Plan: pending improvement in respiratory status, repeat thoracentesis tomorrow  Consultants:   None  Procedures:   Thoracentesis 10/22  Antimicrobials:   none    Subjective: Patient reports he felt better initially after thoracentesis yesterday but still felt short of breath overnight.  He mentions that he still feels he needs to wear supplemental oxygen.     Objective: Vitals:   01/14/17 2105 01/15/17 0438 01/15/17 0841 01/15/17 1400  BP: (!) 151/82 (!) 174/68  138/62  Pulse: 92 88  93  Resp: 20 20  20   Temp: 98.5 F (36.9 C) 98.9 F (37.2 C)  98.2 F (36.8 C)  TempSrc: Oral Oral  Oral  SpO2: 94% 94% 96% 99%  Weight:      Height:        Intake/Output Summary (Last 24 hours) at 01/15/17 1550 Last data filed at 01/15/17 1200  Gross per 24 hour  Intake                0 ml  Output             1450 ml  Net            -1450 ml   Filed Weights   01/13/17 0904 01/13/17 1627  Weight: 88.5 kg (195 lb) 88.5 kg (195 lb)    Examination:  General exam: Appears calm and comfortable  Respiratory system: few rhonchi in right upper lobe otherwise minimal lung sounds throughout the right lung Cardiovascular system: S1 & S2 heard, RRR. No JVD, murmurs, rubs, gallops  or clicks. No pedal edema. Gastrointestinal system: Abdomen is nondistended, soft and nontender. No organomegaly or masses felt. Normal bowel sounds heard. Central nervous system: Alert and oriented. No focal neurological deficits. Extremities: Symmetric 5 x 5 power. Skin: No rashes, lesions or ulcers Psychiatry: Judgement and insight appear normal. Mood & affect appropriate.     Data Reviewed: I have personally reviewed following labs and imaging studies  CBC:  Recent Labs Lab 01/13/17 0926  WBC 6.7  HGB 10.3*  HCT 32.5*  MCV 77.4*  PLT 710   Basic Metabolic Panel:  Recent Labs Lab 01/13/17 0926  NA 140  K 4.4  CL 103  CO2 27   GLUCOSE 133*  BUN 18  CREATININE 1.18  CALCIUM 9.2   GFR: Estimated Creatinine Clearance: 45.3 mL/min (by C-G formula based on SCr of 1.18 mg/dL). Liver Function Tests:  Recent Labs Lab 01/13/17 0926 01/13/17 1639  AST 16  --   ALT 9*  --   ALKPHOS 65  --   BILITOT 0.3  --   PROT 8.0 7.6  ALBUMIN 3.5 3.3*   No results for input(s): LIPASE, AMYLASE in the last 168 hours. No results for input(s): AMMONIA in the last 168 hours. Coagulation Profile: No results for input(s): INR, PROTIME in the last 168 hours. Cardiac Enzymes:  Recent Labs Lab 01/13/17 0926  TROPONINI <0.03   BNP (last 3 results) No results for input(s): PROBNP in the last 8760 hours. HbA1C: No results for input(s): HGBA1C in the last 72 hours. CBG:  Recent Labs Lab 01/14/17 1644 01/14/17 2138 01/15/17 0726 01/15/17 1130 01/15/17 1250  GLUCAP 76 70 75 65 185*   Lipid Profile: No results for input(s): CHOL, HDL, LDLCALC, TRIG, CHOLHDL, LDLDIRECT in the last 72 hours. Thyroid Function Tests: No results for input(s): TSH, T4TOTAL, FREET4, T3FREE, THYROIDAB in the last 72 hours. Anemia Panel: No results for input(s): VITAMINB12, FOLATE, FERRITIN, TIBC, IRON, RETICCTPCT in the last 72 hours. Sepsis Labs: No results for input(s): PROCALCITON, LATICACIDVEN in the last 168 hours.  Recent Results (from the past 240 hour(s))  Culture, body fluid-bottle     Status: None (Preliminary result)   Collection Time: 01/14/17 12:50 PM  Result Value Ref Range Status   Specimen Description PLEURAL COLLECTED BY DOCTOR  Final   Special Requests BOTTLES DRAWN AEROBIC AND ANAEROBIC 10 CC EACH  Final   Culture NO GROWTH < 24 HOURS  Final   Report Status PENDING  Incomplete  Gram stain     Status: None   Collection Time: 01/14/17 12:50 PM  Result Value Ref Range Status   Specimen Description PLEURAL  Final   Special Requests NONE  Final   Gram Stain   Final    CYTOSPIN SMEAR NO ORGANISMS SEEN WBC SEEN WBC  PRESENT, PREDOMINANTLY MONONUCLEAR    Report Status 01/14/2017 FINAL  Final         Radiology Studies: Dg Chest 1 View  Result Date: 01/14/2017 CLINICAL DATA:  RIGHT pleural effusion post thoracentesis EXAM: CHEST 1 VIEW COMPARISON:  01/13/2017 FINDINGS: Enlargement of cardiac silhouette. Stable mediastinal contours. Mater RIGHT pleural effusion with significant atelectasis of the mid to lower RIGHT lung though both improved since the previous exam, which demonstrated complete opacification of the RIGHT hemithorax. LEFT basilar atelectasis consistent with expiratory technique. No pneumothorax. IMPRESSION: No pneumothorax following RIGHT thoracentesis. Residual moderate RIGHT pleural effusion and basilar atelectasis. Electronically Signed   By: Lavonia Dana M.D.   On: 01/14/2017 14:14  Ct Chest Wo Contrast  Result Date: 01/15/2017 CLINICAL DATA:  Two week history of shortness of breath which has worsened over the past 2-3 days. Follow-up right pleural effusion and possible right lung malignancy. Thoracentesis was performed yesterday. EXAM: CT CHEST WITHOUT CONTRAST TECHNIQUE: Multidetector CT imaging of the chest was performed following the standard protocol without IV contrast. COMPARISON:  01/13/2017. FINDINGS: Cardiovascular: Heart mildly enlarged as noted previously. Mild LAD and left circumflex coronary atherosclerosis. Very small, likely physiologic pericardial effusion, unchanged. Moderate to severe atherosclerosis involving the thoracic and upper abdominal aorta without evidence of aneurysm. Mediastinum/Nodes: Lymphadenopathy described on the CT 2 days ago and is less well demonstrated without IV contrast. Fluid and possible mass involving the upper thoracic esophagus. Small hiatal hernia. Lungs/Pleura: Interval decrease in the right pleural effusion after the thoracentesis yesterday, though a large right pleural effusion persists. There is dense consolidation in the right lower lobe and in  the right middle lobe, and there is patchy airspace disease involving the right upper lobe. Multiple nodules are present in the right upper lobe, better seen on today's examination as there is improved aeration in the right upper lobe when compared to the CT 2 days ago. The largest nodule measures approximately 1.7 x 1.8 cm (series 4, image 82). Left lung clear without nodularity or consolidation. No left pleural effusion. Near complete occlusion of the right lower lobe bronchus (best seen on series 4, image 91). Upper Abdomen: Cholelithiasis with calcified gallstones in the gallbladder. No acute abnormalities involving the visualized upper abdomen. 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 Lucent lesions in L1 and L2 as identified on the prior CT. IMPRESSION: 1. Persistent large right pleural effusion, though the effusion has decreased in size after the thoracentesis yesterday. 2. Multiple right upper lobe lung nodules, the largest measuring approximately 1.8 cm. These nodules are better seen as the right upper lobe is better aerated on today's examination. 3. Fluid and/or mass involving the upper thoracic esophagus. Does the patient have dysphagia? 4. Near complete occlusion of the left lower lobe bronchus. 5. Lymphadenopathy visualized on the CT 2 days ago is not as well seen on today's unenhanced examination. 6. Cholelithiasis without evidence of acute cholecystitis. 7. Lucent lesions in L1 and L2 as identified on the prior CT. As recommended on that report, lumbar spine MRI without and with contrast for nuclear medicine whole-body bone scan may be helpful in further evaluation. 8.  Aortic Atherosclerosis (ICD10-170.0) Electronically Signed   By: Evangeline Dakin M.D.   On: 01/15/2017 15:34   US Thoracentesis Asp Pleural Space W/img Guide  Result Date: 01/14/2017 INDICATION: RIGHT pleural effusion EXAM: ULTRASOUND GUIDED DIAGNOSTIC AND THERAPEUTIC RIGHT THORACENTESIS  MEDICATIONS: None. COMPLICATIONS: None immediate. PROCEDURE: Procedure, benefits, and risks of procedure were discussed with patient. Written informed consent for procedure was obtained. Time out protocol followed. Pleural effusion localized by ultrasound at the posterior RIGHT hemithorax. Skin prepped and draped in usual sterile fashion. Skin and soft tissues anesthetized with 9 mL of 1% lidocaine. 8 French thoracentesis catheter placed into the RIGHT pleural space. 1.5 L of clear yellow RIGHT pleural fluid aspirated by syringe pump. Procedure tolerated well by patient without immediate complication. FINDINGS: A total of approximately 1.5 L of RIGHT pleural fluid was removed. Samples were sent to laboratory for requested analysis. IMPRESSION: Successful ultrasound guided RIGHT thoracentesis yielding 1.5 L of pleural fluid. Electronically Signed   By: Lavonia Dana M.D.   On: 01/14/2017 14:20  Scheduled Meds: . amLODipine  2.5 mg Oral Daily  . insulin aspart  0-9 Units Subcutaneous TID WC  . mouth rinse  15 mL Mouth Rinse BID   Continuous Infusions:   LOS: 1 day    Time spent: 25 minutes    Loretha Stapler, MD [facility] Pager [phone]  If 7PM-7AM, please contact night-coverage [email or web address] Password [email or web address] 01/15/2017, 3:50 PM

## 2017-01-15 NOTE — Progress Notes (Addendum)
Hypoglycemic Event  CBG: 65  Treatment: Juice  Symptoms: none   Follow-up CBG Result:185  Possible Reasons for Event: patient recently NPO, now has diet order in place.   Comments/MD notified:Kadolph     Akeem Heppler

## 2017-01-16 ENCOUNTER — Inpatient Hospital Stay (HOSPITAL_COMMUNITY): Payer: Medicare Other

## 2017-01-16 ENCOUNTER — Encounter (HOSPITAL_COMMUNITY): Payer: Self-pay

## 2017-01-16 LAB — GLUCOSE, CAPILLARY
GLUCOSE-CAPILLARY: 147 mg/dL — AB (ref 65–99)
GLUCOSE-CAPILLARY: 158 mg/dL — AB (ref 65–99)
Glucose-Capillary: 125 mg/dL — ABNORMAL HIGH (ref 65–99)
Glucose-Capillary: 164 mg/dL — ABNORMAL HIGH (ref 65–99)

## 2017-01-16 LAB — PH, BODY FLUID: pH, Body Fluid: 7.6

## 2017-01-16 NOTE — Consult Note (Signed)
Consult requested by: [facility] Consult requested for: Abnormal chest CT/pleural effusion  HPI: This is a 83 year old who came to the emergency department because of shortness of breath. He has had about a 2 week history of shortness of breath. He got to the point that he was not able to perform his ADLs. He denies any fever chills chest pain nausea vomiting diarrhea or weight loss. He has had a cough. In addition to his shortness of breath he has a history of diabetes GERD hypertension and hyperlipidemia. When he came to the emergency department he was found to be hypoxic and was placed on nasal cannula. Chest x-ray showed large right-sided pleural effusion. CT of the chest abdomen and pelvis was done which I have personally reviewed and showed possible right lung mass and a large right pleural effusion.  Past Medical History:  Diagnosis Date  . Diabetes mellitus without complication ([place])   . GERD (gastroesophageal reflux disease)   . Hyperlipidemia   . Hypertension      History reviewed. No pertinent family history.  No known family history of any sort of lung disease although he seems somewhat unsure of some of the family history  Social History   Social History  . Marital status: Widowed    Spouse name: N/A  . Number of children: N/A  . Years of education: N/A   Social History Main Topics  . Smoking status: Never Smoker  . Smokeless tobacco: Never Used  . Alcohol use Yes     Comment: occ  . Drug use: No  . Sexual activity: Not Asked   Other Topics Concern  . None   Social History Narrative  . None     ROS: Except as mentioned 10 point review of systems is negative    Objective: Vital signs in last 24 hours: Temp:  [98.2 F (36.8 C)-98.7 F (37.1 C)] 98.3 F (36.8 C) (10/24 0431) Pulse Rate:  [85-93] 85 (10/24 0431) Resp:  [18-20] 18 (10/24 0431) BP: (138-170)/(62-80) 166/70 (10/24 0431) SpO2:  [97 %-100 %] 99 % (10/24 0431) Weight change:  Last BM  Date: 01/14/17  Intake/Output from previous day: 10/23 0701 - 10/24 0700 In: 240 [P.O.:240] Out: 400 [Urine:400]  PHYSICAL EXAM Constitutional: He is awake and alert and in no acute distress. Eyes: Pupils react. EOMI. Ears nose mouth and throat: Hearing is grossly normal. Mucous membranes are moist. Throat is clear. Cardiovascular: His heart is regular with normal heart sounds. No edema. Respiratory: His respiratory effort is somewhat increased. He has diminished breath sounds on the right. Gastrointestinal: No masses in his abdomen. Bowel sounds are present. Skin: Good skin turgor. Musculoskeletal: Normal muscle strength. Neurological: No focal abnormalities. Psychiatric: Normal mood and affect  Lab Results: Basic Metabolic Panel:  Recent Labs  01/13/17 0926  NA 140  K 4.4  CL 103  CO2 27  GLUCOSE 133*  BUN 18  CREATININE 1.18  CALCIUM 9.2   Liver Function Tests:  Recent Labs  01/13/17 0926 01/13/17 1639  AST 16  --   ALT 9*  --   ALKPHOS 65  --   BILITOT 0.3  --   PROT 8.0 7.6  ALBUMIN 3.5 3.3*   No results for input(s): LIPASE, AMYLASE in the last 72 hours. No results for input(s): AMMONIA in the last 72 hours. CBC:  Recent Labs  01/13/17 0926  WBC 6.7  HGB 10.3*  HCT 32.5*  MCV 77.4*  PLT 264   Cardiac Enzymes:  Recent  Labs  01/13/17 0926  TROPONINI <0.03   BNP: No results for input(s): PROBNP in the last 72 hours. D-Dimer: No results for input(s): DDIMER in the last 72 hours. CBG:  Recent Labs  01/15/17 0726 01/15/17 1130 01/15/17 1250 01/15/17 1613 01/15/17 2114 01/16/17 0736  GLUCAP 75 65 185* 122* 153* 125*   Hemoglobin A1C: No results for input(s): HGBA1C in the last 72 hours. Fasting Lipid Panel: No results for input(s): CHOL, HDL, LDLCALC, TRIG, CHOLHDL, LDLDIRECT in the last 72 hours. Thyroid Function Tests: No results for input(s): TSH, T4TOTAL, FREET4, T3FREE, THYROIDAB in the last 72 hours. Anemia Panel: No results for  input(s): VITAMINB12, FOLATE, FERRITIN, TIBC, IRON, RETICCTPCT in the last 72 hours. Coagulation: No results for input(s): LABPROT, INR in the last 72 hours. Urine Drug Screen: Drugs of Abuse  No results found for: LABOPIA, COCAINSCRNUR, LABBENZ, AMPHETMU, THCU, LABBARB  Alcohol Level: No results for input(s): ETH in the last 72 hours. Urinalysis: No results for input(s): COLORURINE, LABSPEC, PHURINE, GLUCOSEU, HGBUR, BILIRUBINUR, KETONESUR, PROTEINUR, UROBILINOGEN, NITRITE, LEUKOCYTESUR in the last 72 hours.  Invalid input(s): APPERANCEUR Misc. Labs:   ABGS: No results for input(s): PHART, PO2ART, TCO2, HCO3 in the last 72 hours.  Invalid input(s): PCO2   MICROBIOLOGY: Recent Results (from the past 240 hour(s))  Acid Fast Smear (AFB)     Status: None   Collection Time: 01/14/17 12:50 PM  Result Value Ref Range Status   AFB Specimen Processing Concentration  Final   Acid Fast Smear Negative  Final    Comment: (NOTE) Performed At: [facility] [address] [place], [place] [id number] Lindon Romp MD DX:[id number]    Source (AFB) PLEURAL  Final  Culture, body fluid-bottle     Status: None (Preliminary result)   Collection Time: 01/14/17 12:50 PM  Result Value Ref Range Status   Specimen Description PLEURAL COLLECTED BY DOCTOR  Final   Special Requests BOTTLES DRAWN AEROBIC AND ANAEROBIC 10 CC EACH  Final   Culture NO GROWTH 2 DAYS  Final   Report Status PENDING  Incomplete  Gram stain     Status: None   Collection Time: 01/14/17 12:50 PM  Result Value Ref Range Status   Specimen Description PLEURAL  Final   Special Requests NONE  Final   Gram Stain   Final    CYTOSPIN SMEAR NO ORGANISMS SEEN WBC SEEN WBC PRESENT, PREDOMINANTLY MONONUCLEAR    Report Status 01/14/2017 FINAL  Final    Studies/Results: Dg Chest 1 View  Result Date: 01/14/2017 CLINICAL DATA:  RIGHT pleural effusion post thoracentesis EXAM: CHEST 1 VIEW COMPARISON:  01/13/2017 FINDINGS:  Enlargement of cardiac silhouette. Stable mediastinal contours. Mater RIGHT pleural effusion with significant atelectasis of the mid to lower RIGHT lung though both improved since the previous exam, which demonstrated complete opacification of the RIGHT hemithorax. LEFT basilar atelectasis consistent with expiratory technique. No pneumothorax. IMPRESSION: No pneumothorax following RIGHT thoracentesis. Residual moderate RIGHT pleural effusion and basilar atelectasis. Electronically Signed   By: Lavonia Dana M.D.   On: 01/14/2017 14:14   Ct Chest Wo Contrast  Result Date: 01/15/2017 CLINICAL DATA:  Two week history of shortness of breath which has worsened over the past 2-3 days. Follow-up right pleural effusion and possible right lung malignancy. Thoracentesis was performed yesterday. EXAM: CT CHEST WITHOUT CONTRAST TECHNIQUE: Multidetector CT imaging of the chest was performed following the standard protocol without IV contrast. COMPARISON:  01/13/2017. FINDINGS: Cardiovascular: Heart mildly enlarged as noted previously. Mild LAD and  left circumflex coronary atherosclerosis. Very small, likely physiologic pericardial effusion, unchanged. Moderate to severe atherosclerosis involving the thoracic and upper abdominal aorta without evidence of aneurysm. Mediastinum/Nodes: Lymphadenopathy described on the CT 2 days ago and is less well demonstrated without IV contrast. Fluid and possible mass involving the upper thoracic esophagus. Small hiatal hernia. Lungs/Pleura: Interval decrease in the right pleural effusion after the thoracentesis yesterday, though a large right pleural effusion persists. There is dense consolidation in the right lower lobe and in the right middle lobe, and there is patchy airspace disease involving the right upper lobe. Multiple nodules are present in the right upper lobe, better seen on today's examination as there is improved aeration in the right upper lobe when compared to the CT 2 days  ago. The largest nodule measures approximately 1.7 x 1.8 cm (series 4, image 82). Left lung clear without nodularity or consolidation. No left pleural effusion. Near complete occlusion of the right lower lobe bronchus (best seen on series 4, image 91). Upper Abdomen: Cholelithiasis with calcified gallstones in the gallbladder. No acute abnormalities involving the visualized upper abdomen. 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 Lucent lesions in L1 and L2 as identified on the prior CT. IMPRESSION: 1. Persistent large right pleural effusion, though the effusion has decreased in size after the thoracentesis yesterday. 2. Multiple right upper lobe lung nodules, the largest measuring approximately 1.8 cm. These nodules are better seen as the right upper lobe is better aerated on today's examination. 3. Fluid and/or mass involving the upper thoracic esophagus. Does the patient have dysphagia? 4. Near complete occlusion of the left lower lobe bronchus. 5. Lymphadenopathy visualized on the CT 2 days ago is not as well seen on today's unenhanced examination. 6. Cholelithiasis without evidence of acute cholecystitis. 7. Lucent lesions in L1 and L2 as identified on the prior CT. As recommended on that report, lumbar spine MRI without and with contrast for nuclear medicine whole-body bone scan may be helpful in further evaluation. 8.  Aortic Atherosclerosis (ICD10-170.0) Electronically Signed   By: Evangeline Dakin M.D.   On: 01/15/2017 15:34   US Thoracentesis Asp Pleural Space W/img Guide  Result Date: 01/14/2017 INDICATION: RIGHT pleural effusion EXAM: ULTRASOUND GUIDED DIAGNOSTIC AND THERAPEUTIC RIGHT THORACENTESIS MEDICATIONS: None. COMPLICATIONS: None immediate. PROCEDURE: Procedure, benefits, and risks of procedure were discussed with patient. Written informed consent for procedure was obtained. Time out protocol followed. Pleural effusion localized by ultrasound at the  posterior RIGHT hemithorax. Skin prepped and draped in usual sterile fashion. Skin and soft tissues anesthetized with 9 mL of 1% lidocaine. 8 French thoracentesis catheter placed into the RIGHT pleural space. 1.5 L of clear yellow RIGHT pleural fluid aspirated by syringe pump. Procedure tolerated well by patient without immediate complication. FINDINGS: A total of approximately 1.5 L of RIGHT pleural fluid was removed. Samples were sent to laboratory for requested analysis. IMPRESSION: Successful ultrasound guided RIGHT thoracentesis yielding 1.5 L of pleural fluid. Electronically Signed   By: Lavonia Dana M.D.   On: 01/14/2017 14:20    Medications:  Prior to Admission:  Prescriptions Prior to Admission  Medication Sig Dispense Refill Last Dose  . AMLODIPINE BESYLATE PO Take 10 mg by mouth daily.    Past Week at Unknown time  . LISINOPRIL PO Take 1 tablet by mouth daily.   Past Week at Unknown time  . metFORMIN (GLUCOPHAGE) 1000 MG tablet Take 1,000 mg by mouth 2 (two) times daily with a meal.   Past  Week at Unknown time  . PRAVASTATIN SODIUM PO Take 0.5 tablets by mouth daily.   Past Week at Unknown time  . Vitamin D, Ergocalciferol, (DRISDOL) 50000 units CAPS capsule Take 50,000 Units by mouth every 7 (seven) days. wednesday   Past Week at Unknown time  . cephALEXin (KEFLEX) 500 MG capsule Take 1 capsule (500 mg total) by mouth 3 (three) times daily. (Patient not taking: Reported on 01/13/2017) 30 capsule 0 Not Taking at Unknown time   Scheduled: . amLODipine  2.5 mg Oral Daily  . insulin aspart  0-9 Units Subcutaneous TID WC  . lisinopril  5 mg Oral Daily  . mouth rinse  15 mL Mouth Rinse BID   Continuous:  SMO:[id number] **OR** acetaminophen, albuterol, ondansetron **OR** ondansetron (ZOFRAN) IV, polyethylene glycol  Assesment: He has a right pleural effusion. Cytology is negative but I agree that another thoracentesis is indicated. There is frequently a positive result from cytology on  second thoracentesis. He has a lung mass likely the source of his pleural effusion. If we can get a diagnosis from thoracentesis he may need needle biopsy or other biopsy done. He has what looks like maybe metastatic disease in his spine. PET scan may be helpful. Principal Problem:   Pleural effusion on right Active Problems:   Acute respiratory failure (HCC)   HTN (hypertension)   DM II (diabetes mellitus, type II), controlled ([place])   Status post thoracentesis    Plan: As above second thoracentesis has apparently already been ordered    LOS: 2 days   Dreden Rivere L 01/16/2017, 9:04 AM

## 2017-01-16 NOTE — Progress Notes (Signed)
Thoracentesis complete no signs of distress, 1L yellow colored pleural fluid removed.

## 2017-01-16 NOTE — Procedures (Signed)
   US guided Rt thoracentesis  1 liter yellow fluid removed Tolerated well  Sent for labs per MD  CXR No PTX

## 2017-01-16 NOTE — Progress Notes (Signed)
PROGRESS NOTE    Robert Gates  YPP:[id number] DOB: [birth year] DOA: 01/13/2017 PCP: System, Pcp Not In    Brief Narrative:  83 year old male who presented with dyspnea. Patient does have significant past medical history of type 2 diabetes mellitus, hypertension, GERD and dyslipidemia. Patient developed worsening dyspnea for last 2 weeks, progressive to the point where he became symptomatic with minimal efforts. On initial physical examination blood pressure 144/94, heart rate 95, respiratory 28-34, oxygen saturation 93% on supplemental oxygen. Moist mucous membranes, lungs with decreased breath sounds on the right, dull to percussion, no wheezing, rhonchi, heart S1-S2 present and rhythmic, no cause, rubs or murmurs, the abdomen was soft nontender, no nausea or edema. Sodium 140, potassium 4.4, chloride 103, bicarbonate 27, glucose 133, creatinine 1.18, white count 6.7, hemoglobin 10.3, hematocrit 32.5, platelets 264. Chest x-ray with a large right pleural effusion with complete opacification of the right hemithorax. CT with large right pleural effusion, likely right lower lobe mass and small right pulmonary nodules with large lymph nodes.   Patient was admitted to the hospital with the working diagnosis acute hypoxic respiratory failure due to large right pleural effusion.   Assessment & Plan:   Principal Problem:   Pleural effusion on right Active Problems:   Acute respiratory failure (HCC)   HTN (hypertension)   DM II (diabetes mellitus, type II), controlled (HCC)   Status post thoracentesis   1. Acute hypoxic failure due to large right pleural effusion. Patient sp thoracentesis, 1 liter was removed, will follow on cytology. Due to recurrent nature and suspected to be malignant, patient may need a intrapleural catheter placed. Will follow with pulmonary recommendations. Will continue oxymetry monitoring and supplemental 02 per [place].   2. Hypertension. Will continue blood pressure control  with amlodipine and lisinopril.    3. Type 2 diabetes mellitus. Will continue glucose cover and monitoring with insulin sliding scale, capillary glucose 122, 153, 125, 164.   4. CKD stage 2. Will follow on renal panel in am. Serum bicarbonate at 27 with K at 4.4.    DVT prophylaxis: enoxaparin   Code Status: full Family Communication: no family at the bedside Disposition Plan: homoe   Consultants:   Pulmonary  Procedures:   Thoracentesis x2  Antimicrobials:       Subjective: Patient with improved symptoms after thoracentesis, no chest pain or cough. No nausea or vomiting, no abdominal pain.   Objective: Vitals:   01/15/17 1400 01/15/17 2035 01/15/17 2116 01/16/17 0431  BP: 138/62  (!) 170/80 (!) 166/70  Pulse: 93  90 85  Resp: 20  18 18   Temp: 98.2 F (36.8 C)  98.7 F (37.1 C) 98.3 F (36.8 C)  TempSrc: Oral  Oral Oral  SpO2: 99% 97% 100% 99%  Weight:      Height:        Intake/Output Summary (Last 24 hours) at 01/16/17 1038 Last data filed at 01/16/17 0920  Gross per 24 hour  Intake              720 ml  Output              400 ml  Net              320 ml   Filed Weights   01/13/17 0904 01/13/17 1627  Weight: 88.5 kg (195 lb) 88.5 kg (195 lb)    Examination:   General: Not in pain or dyspnea, deconditioned  Neurology: Awake and alert, non focal  E ENT:  mild pallor, no icterus, oral mucosa moist Cardiovascular: No JVD. S1-S2 present, rhythmic, no gallops, rubs, or murmurs. No lower extremity edema. Pulmonary: decreased breath sounds at the right base. With adequate air movement on the left, with no wheezing, rhonchi or rales. Gastrointestinal. Abdomen flat, no organomegaly, non tender, no rebound or guarding Skin. No rashes Musculoskeletal: no joint deformities     Data Reviewed: I have personally reviewed following labs and imaging studies  CBC:  Recent Labs Lab 01/13/17 0926  WBC 6.7  HGB 10.3*  HCT 32.5*  MCV 77.4*  PLT 696    Basic Metabolic Panel:  Recent Labs Lab 01/13/17 0926  NA 140  K 4.4  CL 103  CO2 27  GLUCOSE 133*  BUN 18  CREATININE 1.18  CALCIUM 9.2   GFR: Estimated Creatinine Clearance: 45.3 mL/min (by C-G formula based on SCr of 1.18 mg/dL). Liver Function Tests:  Recent Labs Lab 01/13/17 0926 01/13/17 1639  AST 16  --   ALT 9*  --   ALKPHOS 65  --   BILITOT 0.3  --   PROT 8.0 7.6  ALBUMIN 3.5 3.3*   No results for input(s): LIPASE, AMYLASE in the last 168 hours. No results for input(s): AMMONIA in the last 168 hours. Coagulation Profile: No results for input(s): INR, PROTIME in the last 168 hours. Cardiac Enzymes:  Recent Labs Lab 01/13/17 0926  TROPONINI <0.03   BNP (last 3 results) No results for input(s): PROBNP in the last 8760 hours. HbA1C: No results for input(s): HGBA1C in the last 72 hours. CBG:  Recent Labs Lab 01/15/17 1130 01/15/17 1250 01/15/17 1613 01/15/17 2114 01/16/17 0736  GLUCAP 65 185* 122* 153* 125*   Lipid Profile: No results for input(s): CHOL, HDL, LDLCALC, TRIG, CHOLHDL, LDLDIRECT in the last 72 hours. Thyroid Function Tests: No results for input(s): TSH, T4TOTAL, FREET4, T3FREE, THYROIDAB in the last 72 hours. Anemia Panel: No results for input(s): VITAMINB12, FOLATE, FERRITIN, TIBC, IRON, RETICCTPCT in the last 72 hours.    Radiology Studies: I have reviewed all of the imaging during this hospital visit personally     Scheduled Meds: . amLODipine  2.5 mg Oral Daily  . insulin aspart  0-9 Units Subcutaneous TID WC  . lisinopril  5 mg Oral Daily  . mouth rinse  15 mL Mouth Rinse BID   Continuous Infusions:   LOS: 2 days        [name], MD [facility] Pager [phone]

## 2017-01-17 ENCOUNTER — Inpatient Hospital Stay (HOSPITAL_COMMUNITY): Payer: Medicare Other

## 2017-01-17 DIAGNOSIS — N182 Chronic kidney disease, stage 2 (mild): Secondary | ICD-10-CM

## 2017-01-17 DIAGNOSIS — E1122 Type 2 diabetes mellitus with diabetic chronic kidney disease: Secondary | ICD-10-CM

## 2017-01-17 LAB — URINALYSIS, ROUTINE W REFLEX MICROSCOPIC
Bilirubin Urine: NEGATIVE
GLUCOSE, UA: NEGATIVE mg/dL
HGB URINE DIPSTICK: NEGATIVE
Ketones, ur: 5 mg/dL — AB
NITRITE: NEGATIVE
Protein, ur: 100 mg/dL — AB
SPECIFIC GRAVITY, URINE: 1.019 (ref 1.005–1.030)
pH: 6 (ref 5.0–8.0)

## 2017-01-17 LAB — CBC WITH DIFFERENTIAL/PLATELET
BASOS PCT: 0 %
Basophils Absolute: 0 10*3/uL (ref 0.0–0.1)
EOS ABS: 0.1 10*3/uL (ref 0.0–0.7)
EOS PCT: 2 %
HCT: 32.6 % — ABNORMAL LOW (ref 39.0–52.0)
HEMOGLOBIN: 10.3 g/dL — AB (ref 13.0–17.0)
LYMPHS ABS: 1.5 10*3/uL (ref 0.7–4.0)
Lymphocytes Relative: 30 %
MCH: 24.2 pg — AB (ref 26.0–34.0)
MCHC: 31.6 g/dL (ref 30.0–36.0)
MCV: 76.5 fL — AB (ref 78.0–100.0)
MONOS PCT: 14 %
Monocytes Absolute: 0.7 10*3/uL (ref 0.1–1.0)
NEUTROS PCT: 54 %
Neutro Abs: 2.8 10*3/uL (ref 1.7–7.7)
PLATELETS: 211 10*3/uL (ref 150–400)
RBC: 4.26 MIL/uL (ref 4.22–5.81)
RDW: 16.5 % — AB (ref 11.5–15.5)
WBC: 5.1 10*3/uL (ref 4.0–10.5)

## 2017-01-17 LAB — MAGNESIUM: Magnesium: 1.4 mg/dL — ABNORMAL LOW (ref 1.7–2.4)

## 2017-01-17 LAB — BASIC METABOLIC PANEL
ANION GAP: 7 (ref 5–15)
BUN: 21 mg/dL — AB (ref 6–20)
CALCIUM: 8.1 mg/dL — AB (ref 8.9–10.3)
CO2: 28 mmol/L (ref 22–32)
CREATININE: 1.37 mg/dL — AB (ref 0.61–1.24)
Chloride: 98 mmol/L — ABNORMAL LOW (ref 101–111)
GFR calc Af Amer: 50 mL/min — ABNORMAL LOW (ref >60)
GFR, EST NON AFRICAN AMERICAN: 43 mL/min — AB (ref >60)
GLUCOSE: 111 mg/dL — AB (ref 65–99)
Potassium: 3.3 mmol/L — ABNORMAL LOW (ref 3.5–5.1)
Sodium: 133 mmol/L — ABNORMAL LOW (ref 135–145)

## 2017-01-17 LAB — GLUCOSE, CAPILLARY
GLUCOSE-CAPILLARY: 97 mg/dL (ref 65–99)
Glucose-Capillary: 113 mg/dL — ABNORMAL HIGH (ref 65–99)
Glucose-Capillary: 161 mg/dL — ABNORMAL HIGH (ref 65–99)
Glucose-Capillary: 141 mg/dL — ABNORMAL HIGH (ref 65–99)
Glucose-Capillary: 248 mg/dL — ABNORMAL HIGH (ref 65–99)

## 2017-01-17 LAB — CHOLESTEROL, BODY FLUID: CHOL FL: 62 mg/dL

## 2017-01-17 LAB — PHOSPHORUS: Phosphorus: 3.9 mg/dL (ref 2.5–4.6)

## 2017-01-17 LAB — LACTATE DEHYDROGENASE: LDH: 138 U/L (ref 98–192)

## 2017-01-17 MED ORDER — POTASSIUM CHLORIDE CRYS ER 20 MEQ PO TBCR
30.0000 meq | EXTENDED_RELEASE_TABLET | Freq: Once | ORAL | Status: AC
  Administered 2017-01-17: 30 meq via ORAL
  Filled 2017-01-17: qty 1

## 2017-01-17 NOTE — Progress Notes (Signed)
Subjective: He says he feels better. He had thoracentesis yesterday with removal of another liter of fluid. Await cytology on that.  Objective: Vital signs in last 24 hours: Temp:  [98 F (36.7 C)-99.2 F (37.3 C)] 98.1 F (36.7 C) (10/25 0625) Pulse Rate:  [80-90] 82 (10/25 0625) Resp:  [18-20] 20 (10/25 0625) BP: (118-120)/(51-61) 120/61 (10/25 0625) SpO2:  [88 %-99 %] 95 % (10/25 0625) Weight change:  Last BM Date: 01/17/17  Intake/Output from previous day: 10/24 0701 - 10/25 0700 In: 480 [P.O.:480] Out: 200 [Urine:200]  PHYSICAL EXAM General appearance: alert, cooperative and no distress Resp: Decreased breath sounds on the right Cardio: regular rate and rhythm, S1, S2 normal, no murmur, click, rub or gallop GI: soft, non-tender; bowel sounds normal; no masses,  no organomegaly Extremities: extremities normal, atraumatic, no cyanosis or edema Awake and alert skin turgor is good  Lab Results:  Results for orders placed or performed during the hospital encounter of 01/13/17 (from the past 48 hour(s))  Glucose, capillary     Status: None   Collection Time: 01/15/17 11:30 AM  Result Value Ref Range   Glucose-Capillary 65 65 - 99 mg/dL   Comment 1 Notify RN    Comment 2 Document in Chart   Glucose, capillary     Status: Abnormal   Collection Time: 01/15/17 12:50 PM  Result Value Ref Range   Glucose-Capillary 185 (H) 65 - 99 mg/dL   Comment 1 Notify RN    Comment 2 Document in Chart   Glucose, capillary     Status: Abnormal   Collection Time: 01/15/17  4:13 PM  Result Value Ref Range   Glucose-Capillary 122 (H) 65 - 99 mg/dL   Comment 1 Notify RN    Comment 2 Document in Chart   Glucose, capillary     Status: Abnormal   Collection Time: 01/15/17  9:14 PM  Result Value Ref Range   Glucose-Capillary 153 (H) 65 - 99 mg/dL   Comment 1 Notify RN    Comment 2 Document in Chart   Glucose, capillary     Status: Abnormal   Collection Time: 01/16/17  7:36 AM  Result  Value Ref Range   Glucose-Capillary 125 (H) 65 - 99 mg/dL   Comment 1 Notify RN    Comment 2 Document in Chart   Glucose, capillary     Status: Abnormal   Collection Time: 01/16/17 11:28 AM  Result Value Ref Range   Glucose-Capillary 164 (H) 65 - 99 mg/dL   Comment 1 Notify RN    Comment 2 Document in Chart   Glucose, capillary     Status: Abnormal   Collection Time: 01/16/17  4:46 PM  Result Value Ref Range   Glucose-Capillary 147 (H) 65 - 99 mg/dL   Comment 1 Notify RN    Comment 2 Document in Chart   Glucose, capillary     Status: Abnormal   Collection Time: 01/16/17 10:18 PM  Result Value Ref Range   Glucose-Capillary 158 (H) 65 - 99 mg/dL   Comment 1 Notify RN    Comment 2 Document in Chart   CBC with Differential/Platelet     Status: Abnormal   Collection Time: 01/17/17  4:07 AM  Result Value Ref Range   WBC 5.1 4.0 - 10.5 K/uL   RBC 4.26 4.22 - 5.81 MIL/uL   Hemoglobin 10.3 (L) 13.0 - 17.0 g/dL   HCT 32.6 (L) 39.0 - 52.0 %   MCV 76.5 (L) 78.0 -  100.0 fL   MCH 24.2 (L) 26.0 - 34.0 pg   MCHC 31.6 30.0 - 36.0 g/dL   RDW 16.5 (H) 11.5 - 15.5 %   Platelets 211 150 - 400 K/uL   Neutrophils Relative % 54 %   Neutro Abs 2.8 1.7 - 7.7 K/uL   Lymphocytes Relative 30 %   Lymphs Abs 1.5 0.7 - 4.0 K/uL   Monocytes Relative 14 %   Monocytes Absolute 0.7 0.1 - 1.0 K/uL   Eosinophils Relative 2 %   Eosinophils Absolute 0.1 0.0 - 0.7 K/uL   Basophils Relative 0 %   Basophils Absolute 0.0 0.0 - 0.1 K/uL  Basic metabolic panel     Status: Abnormal   Collection Time: 01/17/17  4:07 AM  Result Value Ref Range   Sodium 133 (L) 135 - 145 mmol/L   Potassium 3.3 (L) 3.5 - 5.1 mmol/L   Chloride 98 (L) 101 - 111 mmol/L   CO2 28 22 - 32 mmol/L   Glucose, Bld 111 (H) 65 - 99 mg/dL   BUN 21 (H) 6 - 20 mg/dL   Creatinine, Ser 1.37 (H) 0.61 - 1.24 mg/dL   Calcium 8.1 (L) 8.9 - 10.3 mg/dL   GFR calc non Af Amer 43 (L) >60 mL/min   GFR calc Af Amer 50 (L) >60 mL/min    Comment:  (NOTE) The eGFR has been calculated using the CKD EPI equation. This calculation has not been validated in all clinical situations. eGFR's persistently <60 mL/min signify possible Chronic Kidney Disease.    Anion gap 7 5 - 15  Lactate dehydrogenase     Status: None   Collection Time: 01/17/17  4:07 AM  Result Value Ref Range   LDH 138 98 - 192 U/L  Magnesium     Status: Abnormal   Collection Time: 01/17/17  4:07 AM  Result Value Ref Range   Magnesium 1.4 (L) 1.7 - 2.4 mg/dL  Phosphorus     Status: None   Collection Time: 01/17/17  4:07 AM  Result Value Ref Range   Phosphorus 3.9 2.5 - 4.6 mg/dL  Glucose, capillary     Status: Abnormal   Collection Time: 01/17/17  8:05 AM  Result Value Ref Range   Glucose-Capillary 113 (H) 65 - 99 mg/dL    ABGS No results for input(s): PHART, PO2ART, TCO2, HCO3 in the last 72 hours.  Invalid input(s): PCO2 CULTURES Recent Results (from the past 240 hour(s))  Acid Fast Smear (AFB)     Status: None   Collection Time: 01/14/17 12:50 PM  Result Value Ref Range Status   AFB Specimen Processing Concentration  Final   Acid Fast Smear Negative  Final    Comment: (NOTE) Performed At: [facility] [address] [place], [place] [id number] Lindon Romp MD NU:[id number]    Source (AFB) PLEURAL  Final  Culture, body fluid-bottle     Status: None (Preliminary result)   Collection Time: 01/14/17 12:50 PM  Result Value Ref Range Status   Specimen Description PLEURAL COLLECTED BY DOCTOR  Final   Special Requests BOTTLES DRAWN AEROBIC AND ANAEROBIC 10 CC EACH  Final   Culture NO GROWTH 3 DAYS  Final   Report Status PENDING  Incomplete  Gram stain     Status: None   Collection Time: 01/14/17 12:50 PM  Result Value Ref Range Status   Specimen Description PLEURAL  Final   Special Requests NONE  Final   Gram Stain   Final  CYTOSPIN SMEAR NO ORGANISMS SEEN WBC SEEN WBC PRESENT, PREDOMINANTLY MONONUCLEAR    Report Status 01/14/2017  FINAL  Final   Studies/Results: Dg Chest 1 View  Result Date: 01/16/2017 CLINICAL DATA:  Status post right thoracentesis today. EXAM: CHEST 1 VIEW COMPARISON:  CT chest 01/15/2017. Single-view of the chest 01/14/2017. FINDINGS: Right pleural effusion is decreased after thoracentesis. No pneumothorax. Right middle and lower lobe airspace disease identified. Left lung is clear. Heart size is mildly enlarged. IMPRESSION: Negative for pneumothorax after right thoracentesis. Small to moderate residual right pleural effusion with right middle and lower lobe airspace disease. Cardiomegaly without edema. Electronically Signed   By: Inge Rise M.D.   On: 01/16/2017 11:11   Ct Chest Wo Contrast  Result Date: 01/15/2017 CLINICAL DATA:  Two week history of shortness of breath which has worsened over the past 2-3 days. Follow-up right pleural effusion and possible right lung malignancy. Thoracentesis was performed yesterday. EXAM: CT CHEST WITHOUT CONTRAST TECHNIQUE: Multidetector CT imaging of the chest was performed following the standard protocol without IV contrast. COMPARISON:  01/13/2017. FINDINGS: Cardiovascular: Heart mildly enlarged as noted previously. Mild LAD and left circumflex coronary atherosclerosis. Very small, likely physiologic pericardial effusion, unchanged. Moderate to severe atherosclerosis involving the thoracic and upper abdominal aorta without evidence of aneurysm. Mediastinum/Nodes: Lymphadenopathy described on the CT 2 days ago and is less well demonstrated without IV contrast. Fluid and possible mass involving the upper thoracic esophagus. Small hiatal hernia. Lungs/Pleura: Interval decrease in the right pleural effusion after the thoracentesis yesterday, though a large right pleural effusion persists. There is dense consolidation in the right lower lobe and in the right middle lobe, and there is patchy airspace disease involving the right upper lobe. Multiple nodules are present in  the right upper lobe, better seen on today's examination as there is improved aeration in the right upper lobe when compared to the CT 2 days ago. The largest nodule measures approximately 1.7 x 1.8 cm (series 4, image 82). Left lung clear without nodularity or consolidation. No left pleural effusion. Near complete occlusion of the right lower lobe bronchus (best seen on series 4, image 91). Upper Abdomen: Cholelithiasis with calcified gallstones in the gallbladder. No acute abnormalities involving the visualized upper abdomen. 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 Lucent lesions in L1 and L2 as identified on the prior CT. IMPRESSION: 1. Persistent large right pleural effusion, though the effusion has decreased in size after the thoracentesis yesterday. 2. Multiple right upper lobe lung nodules, the largest measuring approximately 1.8 cm. These nodules are better seen as the right upper lobe is better aerated on today's examination. 3. Fluid and/or mass involving the upper thoracic esophagus. Does the patient have dysphagia? 4. Near complete occlusion of the left lower lobe bronchus. 5. Lymphadenopathy visualized on the CT 2 days ago is not as well seen on today's unenhanced examination. 6. Cholelithiasis without evidence of acute cholecystitis. 7. Lucent lesions in L1 and L2 as identified on the prior CT. As recommended on that report, lumbar spine MRI without and with contrast for nuclear medicine whole-body bone scan may be helpful in further evaluation. 8.  Aortic Atherosclerosis (ICD10-170.0) Electronically Signed   By: Evangeline Dakin M.D.   On: 01/15/2017 15:34   US Thoracentesis Asp Pleural Space W/img Guide  Result Date: 01/16/2017 INDICATION: Symptomatic RIGHT sided pleural effusion, prior RIGHT thoracentesis EXAM: US THORACENTESIS ASP PLEURAL SPACE W/IMG GUIDE COMPARISON:  Previous thoracentesis MEDICATIONS: 10 cc 1% lidocaine. COMPLICATIONS: None  immediate.  TECHNIQUE: Informed written consent was obtained from the patient after a discussion of the risks, benefits and alternatives to treatment. A timeout was performed prior to the initiation of the procedure. Initial ultrasound scanning demonstrates a right pleural effusion. The lower chest was prepped and draped in the usual sterile fashion. 1% lidocaine was used for local anesthesia. Under direct ultrasound guidance, a 19 gauge, 7-cm, Yueh catheter was introduced. An ultrasound image was saved for documentation purposes. The thoracentesis was performed. The catheter was removed and a dressing was applied. The patient tolerated the procedure well without immediate post procedural complication. The patient was escorted to have an upright chest radiograph. FINDINGS: A total of approximately 1 liter of yellow fluid was removed. Requested samples were sent to the laboratory. IMPRESSION: Successful ultrasound-guided R sided thoracentesis yielding 1 liter of pleural fluid. Post procedure CXR:  No PTX per Radiologist Read by Lavonia Drafts [facility] Electronically Signed   By: Lavonia Dana M.D.   On: 01/16/2017 11:14    Medications:  Prior to Admission:  Prescriptions Prior to Admission  Medication Sig Dispense Refill Last Dose  . AMLODIPINE BESYLATE PO Take 10 mg by mouth daily.    Past Week at Unknown time  . LISINOPRIL PO Take 1 tablet by mouth daily.   Past Week at Unknown time  . metFORMIN (GLUCOPHAGE) 1000 MG tablet Take 1,000 mg by mouth 2 (two) times daily with a meal.   Past Week at Unknown time  . PRAVASTATIN SODIUM PO Take 0.5 tablets by mouth daily.   Past Week at Unknown time  . Vitamin D, Ergocalciferol, (DRISDOL) 50000 units CAPS capsule Take 50,000 Units by mouth every 7 (seven) days. wednesday   Past Week at Unknown time  . cephALEXin (KEFLEX) 500 MG capsule Take 1 capsule (500 mg total) by mouth 3 (three) times daily. (Patient not taking: Reported on 01/13/2017) 30 capsule 0 Not Taking at Unknown time    Scheduled: . amLODipine  2.5 mg Oral Daily  . insulin aspart  0-9 Units Subcutaneous TID WC  . lisinopril  5 mg Oral Daily  . mouth rinse  15 mL Mouth Rinse BID   Continuous:  YNX:[id number] **OR** acetaminophen, albuterol, ondansetron **OR** ondansetron (ZOFRAN) IV, polyethylene glycol  Assesment: He has a pleural effusion and had another thoracentesis. He has abnormal chest CT with lesions that could be a cancer. I discussed with him today about invasive diagnostic efforts and he's not sure he wants to do that these going to think about it and discuss it with his son. Principal Problem:   Pleural effusion on right Active Problems:   Acute respiratory failure (HCC)   HTN (hypertension)   DM II (diabetes mellitus, type II), controlled ([facility])   Status post thoracentesis    Plan: Await thoracentesis results    LOS: 3 days   Haiden Rawlinson L 01/17/2017, 9:01 AM

## 2017-01-17 NOTE — Evaluation (Signed)
Physical Therapy Evaluation Patient Details Name: Robert Gates MRN: [id number] DOB: [birth year] Today's Date: 01/17/2017   History of Present Illness  83 year old male who presented with dyspnea. Patient does have significant past medical history of type 2 diabetes mellitus, hypertension, GERD and dyslipidemia. Patient developed worsening dyspnea for last 2 weeks,progressive to the point where he became symptomatic with minimal efforts. On initial physical examination blood pressure 144/94, heart rate 95, respiratory 28-34, oxygen saturation 93% on supplemental oxygen. Moist mucous membranes, lungs with decreased breath sounds on the right, dull to percussion, no wheezing, rhonchi, heart S1-S2 present and rhythmic, no cause, rubs or murmurs, the abdomen was soft nontender, no nausea or edema. Sodium 140, potassium 4.4, chloride 103, bicarbonate 27, glucose 133, creatinine 1.18, white count 6.7, hemoglobin 10.3, hematocrit 32.5, platelets 264. Chest x-ray with a large right pleural effusion with complete opacification of the right hemithorax. CT with large right pleural effusion, likely right lower lobe mass and small right pulmonary nodules withlarge lymph nodes.      Clinical Impression  Robert Gates is a 83 y.o. male presenting with impaired mobility secondary to discomfort following right thoracentesis. Patient is motivated to participate in therapy and wishes to return home. [name] is functioning below his baseline of independent and requires assistance for bed mobility, transfers, and gait. He will benefit from continued PT through his length of stay to improve functional independence for a safe discharge home.    Follow Up Recommendations No PT follow up    Equipment Recommendations   None    Recommendations for Other Services       Precautions / Restrictions Precautions Precautions: Fall Restrictions Weight Bearing Restrictions: No      Mobility  Bed Mobility Overal bed  mobility: Needs Assistance Bed Mobility: Rolling;Supine to Sit Rolling: Min assist   Supine to sit: Min assist     General bed mobility comments: patient required verbal and tactile cues  Transfers Overall transfer level: Needs assistance Equipment used: Rolling walker (2 wheeled) Transfers: Sit to/from [facility] Sit to Stand: Min assist Stand pivot transfers: Min guard       General transfer comment: tactile cues and assistance to initiate sit>stand transfer  Ambulation/Gait Ambulation/Gait assistance: Min guard Ambulation Distance (Feet): 6 Feet Assistive device: None Gait Pattern/deviations: WFL(Within Functional Limits)   Gait velocity interpretation: at or above normal speed for age/gender General Gait Details: patient with wide base of support and decreased step length bilaterally      Balance Overall balance assessment: Independent       Pertinent Vitals/Pain Pain Assessment: No/denies pain    Home Living Family/patient expects to be discharged to:: Private residence Living Arrangements: Alone Available Help at Discharge: Family (two adult sons live close by and can help throughout the day as needed) Type of Home: House Home Access: Stairs to enter Entrance Stairs-Rails: None Entrance Stairs-Number of Steps: 2 in the front, 5 in the back, no railings Home Layout: One level Home Equipment: Cane - single point      Prior Function Level of Independence: Independent   Comments: independent for all ADL's, driving throughout the community, community ambulator int [employer], ambulates with a single point cane, able to care for 15+ chickens in his backyard            Extremity/Trunk Assessment   Upper Extremity Assessment Upper Extremity Assessment: Overall WFL for tasks assessed    Lower Extremity Assessment Lower Extremity Assessment: Overall WFL for tasks assessed  Cervical / Trunk Assessment Cervical / Trunk  Assessment: Normal  Communication   Communication: No difficulties  Cognition Arousal/Alertness: Awake/alert Behavior During Therapy: WFL for tasks assessed/performed Overall Cognitive Status: Within Functional Limits for tasks assessed     General Comments: oriented x4, pleasant demeanor      General Comments General comments (skin integrity, edema, etc.): patient with no overt LOB while seated at edge of bed, durign transfers, and gait        Assessment/Plan    PT Assessment Patient needs continued PT services  PT Problem List Decreased activity tolerance;Decreased mobility;Decreased balance       PT Treatment Interventions Stair training;Gait training;Functional mobility training;Therapeutic activities;Therapeutic exercise;Patient/family education    PT Goals (Current goals can be found in the Care Plan section)  Acute Rehab PT Goals Patient Stated Goal: return home to take care of chickens PT Goal Formulation: With patient Time For Goal Achievement: 01/21/17 Potential to Achieve Goals: Good    Frequency Min 3X/week   Barriers to discharge           AM-PAC PT "6 Clicks" Daily Activity  Outcome Measure Difficulty turning over in bed (including adjusting bedclothes, sheets and blankets)?: Unable Difficulty moving from lying on back to sitting on the side of the bed? : Unable Difficulty sitting down on and standing up from a chair with arms (e.g., wheelchair, bedside commode, etc,.)?: Unable Help needed moving to and from a bed to chair (including a wheelchair)?: A Little Help needed walking in hospital room?: A Little Help needed climbing 3-5 steps with a railing? : A Lot 6 Click Score: 11    End of Session   Activity Tolerance: Patient tolerated treatment well Patient left: in chair;with call bell/phone within reach (RN notified of position, O2 via nasal cannula donned) Nurse Communication: Mobility status PT Visit Diagnosis: Other abnormalities of gait and  mobility (R26.89);Unsteadiness on feet (R26.81);Other (comment) (decreased functional independence)    Time: 7078-6754 PT Time Calculation (min) (ACUTE ONLY): 22 min   Charges:   PT Evaluation $PT Eval Low Complexity: 1 Low PT Treatments $Therapeutic Activity: 8-22 mins   PT G Codes:   PT G-Codes **NOT FOR INPATIENT CLASS** Functional Assessment Tool Used: AM-PAC 6 Clicks Basic Mobility Functional Limitation: Mobility: Walking and moving around;Changing and maintaining body position Mobility: Walking and Moving Around Current Status [phone]): At least 60 percent but less than 80 percent impaired, limited or restricted Mobility: Walking and Moving Around Goal Status [phone]): At least 20 percent but less than 40 percent impaired, limited or restricted Changing and Maintaining Body Position Current Status (R9758): At least 60 percent but less than 80 percent impaired, limited or restricted Changing and Maintaining Body Position Goal Status (I3254): At least 20 percent but less than 40 percent impaired, limited or restricted     Debara Pickett, PT, DPT Physical Therapist with [facility]  01/17/2017 4:38 PM

## 2017-01-17 NOTE — Progress Notes (Signed)
MD made aware of UA results, no new orders received, will continue to monitor.

## 2017-01-17 NOTE — Progress Notes (Signed)
Text paged on call MD about decreased Na+ and K+ levels from this mornings lab draw.

## 2017-01-17 NOTE — Progress Notes (Signed)
PROGRESS NOTE    Robert Gates  DJT:[id number] DOB: 1922-04-02 DOA: 01/13/2017 PCP: System, Pcp Not In    Brief Narrative:  83 year old male who presented with dyspnea. Patient does have significant past medical history of type 2 diabetes mellitus, hypertension, GERD and dyslipidemia. Patient developed worsening dyspnea for last 2 weeks, progressive to the point where he became symptomatic with minimal efforts. On initial physical examination blood pressure 144/94, heart rate 95, respiratory 28-34, oxygen saturation 93% on supplemental oxygen. Moist mucous membranes, lungs with decreased breath sounds on the right, dull to percussion, no wheezing, rhonchi, heart S1-S2 present and rhythmic, no cause, rubs or murmurs, the abdomen was soft nontender, no nausea or edema. Sodium 140, potassium 4.4, chloride 103, bicarbonate 27, glucose 133, creatinine 1.18, white count 6.7, hemoglobin 10.3, hematocrit 32.5, platelets 264. Chest x-ray with a large right pleural effusion with complete opacification of the right hemithorax. CT with large right pleural effusion, likely right lower lobe mass and small right pulmonary nodules with large lymph nodes.   Patient was admitted to the hospital with the working diagnosis acute hypoxic respiratory failure due to large right pleural effusion.   Assessment & Plan:   Principal Problem:   Pleural effusion on right Active Problems:   Acute respiratory failure (HCC)   HTN (hypertension)   DM II (diabetes mellitus, type II), controlled (HCC)   Status post thoracentesis   1. Acute hypoxic failure due to large right exudative pleural effusion. Dyspnea improved after second thoracentesis, on physical examination no signs of significant recurrence today. Will continue oxymetry monitoring and supplemental 02 per [place] as needed to target 02 saturation greater than 92%. Fluid analysis cw exudative effusion per Light's criteria. Suspected malignant effusion, if continue  recurrence will plan for pleural catheter, case discussed with Dr Luan Pulling, from Pulmonary.   2. Right upper lobe pulmonary nodules. Multiple nodules in the right upper lobe, with the greatest measuring approximately 1.7 x 1.8 cm. High pretest for malignancy, for malignancy. CT with possible mass in the upper esophagus, will order esophagogram. Need to discuss with family intensity of workup.   2. Hypertension. On amlodipine and lisinopril, systolic blood pressure 300 to 120.   3. Type 2 diabetes mellitus. Capillary glucose 147, 158, 113, 161,141. Continue insulin sliding scale for glucose cover and monitoring. Patient tolerating po well.    4. CKD stage 2. Renal function with stable serum cr 1.1 to 1,3, will continue close follow up of renal function and electrolytes, will follow renal panel in am. Avoid hypotension or nephrotoxic medications.   DVT prophylaxis: enoxaparin   Code Status: full Family Communication: no family at the bedside Disposition Plan: homoe   Consultants:   Pulmonary  Procedures:   Thoracentesis x2  Antimicrobials:   Subjective: Patient feeling better, dyspnea has improved, no cough or wheezing, no chest pain, no nausea or vomiting.   Objective: Vitals:   01/16/17 1524 01/16/17 1949 01/16/17 2215 01/17/17 0625  BP: (!) 120/51  (!) 118/57 120/61  Pulse: 83  80 82  Resp: 18  18 20   Temp: 99.2 F (37.3 C)  98.1 F (36.7 C) 98.1 F (36.7 C)  TempSrc: Oral  Oral Oral  SpO2: 95% (!) 88% 94% 95%  Weight:      Height:        Intake/Output Summary (Last 24 hours) at 01/17/17 0953 Last data filed at 01/17/17 0625  Gross per 24 hour  Intake  0 ml  Output              200 ml  Net             -200 ml   Filed Weights   01/13/17 0904 01/13/17 1627  Weight: 88.5 kg (195 lb) 88.5 kg (195 lb)    Examination:   General: Not in pain or dyspnea, deconditioned Neurology: Awake and alert, non focal  E ENT: mild pallor, no icterus, oral  mucosa moist Cardiovascular: No JVD. S1-S2 present, rhythmic, no gallops, rubs, or murmurs. No lower extremity edema. Pulmonary: decreased breath sounds bilaterally at bases, more right than left, no wheezing, rhonchi. Positive bibasilar rales.  Gastrointestinal. Abdomen flat, no organomegaly, non tender, no rebound or guarding Skin. No rashes Musculoskeletal: no joint deformities     Data Reviewed: I have personally reviewed following labs and imaging studies  CBC:  Recent Labs Lab 01/13/17 0926 01/17/17 0407  WBC 6.7 5.1  NEUTROABS  --  2.8  HGB 10.3* 10.3*  HCT 32.5* 32.6*  MCV 77.4* 76.5*  PLT 264 725   Basic Metabolic Panel:  Recent Labs Lab 01/13/17 0926 01/17/17 0407  NA 140 133*  K 4.4 3.3*  CL 103 98*  CO2 27 28  GLUCOSE 133* 111*  BUN 18 21*  CREATININE 1.18 1.37*  CALCIUM 9.2 8.1*  MG  --  1.4*  PHOS  --  3.9   GFR: Estimated Creatinine Clearance: 39 mL/min (A) (by C-G formula based on SCr of 1.37 mg/dL (H)). Liver Function Tests:  Recent Labs Lab 01/13/17 0926 01/13/17 1639  AST 16  --   ALT 9*  --   ALKPHOS 65  --   BILITOT 0.3  --   PROT 8.0 7.6  ALBUMIN 3.5 3.3*   No results for input(s): LIPASE, AMYLASE in the last 168 hours. No results for input(s): AMMONIA in the last 168 hours. Coagulation Profile: No results for input(s): INR, PROTIME in the last 168 hours. Cardiac Enzymes:  Recent Labs Lab 01/13/17 0926  TROPONINI <0.03   BNP (last 3 results) No results for input(s): PROBNP in the last 8760 hours. HbA1C: No results for input(s): HGBA1C in the last 72 hours. CBG:  Recent Labs Lab 01/16/17 0736 01/16/17 1128 01/16/17 1646 01/16/17 2218 01/17/17 0805  GLUCAP 125* 164* 147* 158* 113*   Lipid Profile: No results for input(s): CHOL, HDL, LDLCALC, TRIG, CHOLHDL, LDLDIRECT in the last 72 hours. Thyroid Function Tests: No results for input(s): TSH, T4TOTAL, FREET4, T3FREE, THYROIDAB in the last 72 hours. Anemia  Panel: No results for input(s): VITAMINB12, FOLATE, FERRITIN, TIBC, IRON, RETICCTPCT in the last 72 hours.    Radiology Studies: I have reviewed all of the imaging during this hospital visit personally     Scheduled Meds: . amLODipine  2.5 mg Oral Daily  . insulin aspart  0-9 Units Subcutaneous TID WC  . lisinopril  5 mg Oral Daily  . mouth rinse  15 mL Mouth Rinse BID   Continuous Infusions:   LOS: 3 days        Sabastien Tyler [name], MD [facility] Pager [phone]

## 2017-01-18 ENCOUNTER — Encounter (HOSPITAL_COMMUNITY): Payer: Self-pay | Admitting: Primary Care

## 2017-01-18 ENCOUNTER — Inpatient Hospital Stay (HOSPITAL_COMMUNITY): Payer: Medicare Other

## 2017-01-18 DIAGNOSIS — R229 Localized swelling, mass and lump, unspecified: Secondary | ICD-10-CM

## 2017-01-18 DIAGNOSIS — N390 Urinary tract infection, site not specified: Secondary | ICD-10-CM

## 2017-01-18 LAB — GLUCOSE, CAPILLARY
Glucose-Capillary: 148 mg/dL — ABNORMAL HIGH (ref 65–99)
Glucose-Capillary: 175 mg/dL — ABNORMAL HIGH (ref 65–99)
Glucose-Capillary: 92 mg/dL (ref 65–99)
Glucose-Capillary: 147 mg/dL — ABNORMAL HIGH (ref 65–99)

## 2017-01-18 MED ORDER — CEPHALEXIN 250 MG PO CAPS: 500.0000 mg | ORAL_CAPSULE | Freq: Four times a day (QID) | ORAL | Status: DC

## 2017-01-18 MED ORDER — CEPHALEXIN 500 MG PO CAPS
500.0000 mg | ORAL_CAPSULE | Freq: Three times a day (TID) | ORAL | Status: DC
  Administered 2017-01-18 – 2017-01-22 (×12): 500 mg via ORAL
  Filled 2017-01-18 (×12): qty 1

## 2017-01-18 NOTE — Plan of Care (Signed)
Mr. Caine is sleeping soundly with no family at bedside.  I do not wake him to have palliative conversation without family present. Hospitalist aware.  No charge.

## 2017-01-18 NOTE — Care Management Important Message (Signed)
Important Message  Patient Details  Name: Robert Gates MRN: [id number] Date of Birth: Jul 27, 1922   Medicare Important Message Given:  Yes    Sherald Barge, RN 01/18/2017, 12:47 PM

## 2017-01-18 NOTE — Progress Notes (Signed)
PT Cancellation Note  Patient Details Name: Robert Gates MRN: [id number] DOB: 23-Oct-1922   Cancelled Treatment:    Reason Eval/Treat Not Completed: Patient at procedure or test/unavailable  Patient off floor for chest/abdominal x-ray. PT will attempt later today.   Debara Pickett, PT, DPT Physical Therapist with [facility]  01/18/2017 9:33 AM

## 2017-01-18 NOTE — Progress Notes (Signed)
PROGRESS NOTE    Robert Gates  [id number]:[id number] DOB: [birth year] DOA: 01/13/2017 PCP: System, Pcp Not In    Brief Narrative:  83 year old male who presented with dyspnea. Patient does have significant past medical history of type 2 diabetes mellitus, hypertension, GERD and dyslipidemia. Patient developed worsening dyspnea for last 2 weeks,progressive to the point where he became symptomatic with minimal efforts. On initial physical examination blood pressure 144/94, heart rate 95, respiratory 28-34, oxygen saturation 93% on supplemental oxygen. Moist mucous membranes, lungs with decreased breath sounds on the right, dull to percussion, no wheezing, rhonchi, heart S1-S2 present and rhythmic, no cause, rubs or murmurs, the abdomen was soft nontender, no nausea or edema. Sodium 140, potassium 4.4, chloride 103, bicarbonate 27, glucose 133, creatinine 1.18, white count 6.7, hemoglobin 10.3, hematocrit 32.5, platelets 264. Chest x-ray with a large right pleural effusion with complete opacification of the right hemithorax. CT with large right pleural effusion, likely right lower lobe mass and small right pulmonary nodules withlarge lymph nodes.   Patient was admitted to the hospital with the working diagnosis acute hypoxic respiratory failure due to large right pleural effusion.   Assessment & Plan:   Principal Problem:   Pleural effusion on right Active Problems:   Acute respiratory failure (HCC)   HTN (hypertension)   DM II (diabetes mellitus, type II), controlled (HCC)   Status post thoracentesis   1. Acute hypoxic failure due to large right exudative pleural effusion. Follow chest film personally reviewed, noted improved right effusion. Will continue radiographic surveillance, likely patient will need a pleural catheter.    2. Right upper lobe pulmonary nodules. Multiple nodules in the right upper lobe, with the greatest measuring approximately 1.7 x 1.8 cm.  I spoke with Robert Gates and his  son at the bedside, discuss diagnostic possibilities including lung malignancy. Different work up approaches including conservative management. Patient at this point prefers not to have any surgical intervention and agrees with conservative approach. Will consult palliative care for assistance.   2. Hypertension. Continue amlodipine and lisinopril, for blood pressure control.  3. Type 2 diabetes mellitus. Capillary glucose 141, 97, 248, 148, 175. On insulin sliding scale for glucose cover and monitoring.     4. CKD stage 2. Patient tolerating po well, will follow on renal panel in am, avoid hypotension and nephrotoxic medications.  5. NEW urinary  tract infection. Positive pyuria, will add antibiotic therapy with cephalexin po and will follow on urine cultures.   DVT prophylaxis:enoxaparin  Code Status:full Family Communication:no family at the bedside Disposition Plan:homoe   Consultants:  Pulmonary  Procedures:  Thoracentesis x2  Antimicrobials:    Subjective: Patient feeling better, no nausea or vomiting, dyspnea has improved. No abdominal pain, no nausea or vomiting.   Objective: Vitals:   01/17/17 0625 01/17/17 2028 01/17/17 2113 01/18/17 0601  BP: 120/61  118/62 120/64  Pulse: 82  80 74  Resp: 20  20 20   Temp: 98.1 F (36.7 C)  98 F (36.7 C) 98.2 F (36.8 C)  TempSrc: Oral  Oral Oral  SpO2: 95% 98% 98% 97%  Weight:    88.5 kg (195 lb 1.7 oz)  Height:        Intake/Output Summary (Last 24 hours) at 01/18/17 1000 Last data filed at 01/18/17 0602  Gross per 24 hour  Intake              240 ml  Output  500 ml  Net             -260 ml   Filed Weights   01/13/17 0904 01/13/17 1627 01/18/17 0601  Weight: 88.5 kg (195 lb) 88.5 kg (195 lb) 88.5 kg (195 lb 1.7 oz)    Examination:   General: Not in pain or dyspnea, deonditioned Neurology: Awake and alert, non focal  E ENT: mild pallor, no icterus, oral mucosa moist Cardiovascular:  No JVD. S1-S2 present, rhythmic, no gallops, rubs, or murmurs. No lower extremity edema. Pulmonary: decreased breath sounds bilaterally, more prominent at the right base adequate air movement, no wheezing, rhonchi. Bibasilar rales more right than left. Gastrointestinal. Abdomen distended, no organomegaly, non tender, no rebound or guarding Skin. No rashes Musculoskeletal: no joint deformities     Data Reviewed: I have personally reviewed following labs and imaging studies  CBC:  Recent Labs Lab 01/13/17 0926 01/17/17 0407  WBC 6.7 5.1  NEUTROABS  --  2.8  HGB 10.3* 10.3*  HCT 32.5* 32.6*  MCV 77.4* 76.5*  PLT 264 371   Basic Metabolic Panel:  Recent Labs Lab 01/13/17 0926 01/17/17 0407  NA 140 133*  K 4.4 3.3*  CL 103 98*  CO2 27 28  GLUCOSE 133* 111*  BUN 18 21*  CREATININE 1.18 1.37*  CALCIUM 9.2 8.1*  MG  --  1.4*  PHOS  --  3.9   GFR: Estimated Creatinine Clearance: 39 mL/min (A) (by C-G formula based on SCr of 1.37 mg/dL (H)). Liver Function Tests:  Recent Labs Lab 01/13/17 0926 01/13/17 1639  AST 16  --   ALT 9*  --   ALKPHOS 65  --   BILITOT 0.3  --   PROT 8.0 7.6  ALBUMIN 3.5 3.3*   No results for input(s): LIPASE, AMYLASE in the last 168 hours. No results for input(s): AMMONIA in the last 168 hours. Coagulation Profile: No results for input(s): INR, PROTIME in the last 168 hours. Cardiac Enzymes:  Recent Labs Lab 01/13/17 0926  TROPONINI <0.03   BNP (last 3 results) No results for input(s): PROBNP in the last 8760 hours. HbA1C: No results for input(s): HGBA1C in the last 72 hours. CBG:  Recent Labs Lab 01/17/17 1124 01/17/17 1141 01/17/17 1607 01/17/17 2108 01/18/17 0744  GLUCAP 161* 141* 97 248* 148*   Lipid Profile: No results for input(s): CHOL, HDL, LDLCALC, TRIG, CHOLHDL, LDLDIRECT in the last 72 hours. Thyroid Function Tests: No results for input(s): TSH, T4TOTAL, FREET4, T3FREE, THYROIDAB in the last 72  hours. Anemia Panel: No results for input(s): VITAMINB12, FOLATE, FERRITIN, TIBC, IRON, RETICCTPCT in the last 72 hours.    Radiology Studies: I have reviewed all of the imaging during this hospital visit personally     Scheduled Meds: . amLODipine  2.5 mg Oral Daily  . insulin aspart  0-9 Units Subcutaneous TID WC  . lisinopril  5 mg Oral Daily  . mouth rinse  15 mL Mouth Rinse BID   Continuous Infusions:   LOS: 4 days        Mauricio [name], MD [facility] Pager [phone]

## 2017-01-18 NOTE — Progress Notes (Signed)
Subjective: He says he feels okay. No results yet from second thoracentesis.  Objective: Vital signs in last 24 hours: Temp:  [98 F (36.7 C)-98.2 F (36.8 C)] 98.2 F (36.8 C) (10/26 0601) Pulse Rate:  [74-80] 74 (10/26 0601) Resp:  [20] 20 (10/26 0601) BP: (118-120)/(62-64) 120/64 (10/26 0601) SpO2:  [97 %-98 %] 97 % (10/26 0601) Weight:  [88.5 kg (195 lb 1.7 oz)] 88.5 kg (195 lb 1.7 oz) (10/26 0601) Weight change:  Last BM Date: 01/17/17  Intake/Output from previous day: 10/25 0701 - 10/26 0700 In: 480 [P.O.:480] Out: 500 [Urine:500]  PHYSICAL EXAM General appearance: alert, cooperative and no distress Resp: He still has decreased breath sounds on the right Cardio: regular rate and rhythm, S1, S2 normal, no murmur, click, rub or gallop GI: soft, non-tender; bowel sounds normal; no masses,  no organomegaly Extremities: extremities normal, atraumatic, no cyanosis or edema Skin warm and dry  Lab Results:  Results for orders placed or performed during the hospital encounter of 01/13/17 (from the past 48 hour(s))  Glucose, capillary     Status: Abnormal   Collection Time: 01/16/17 11:28 AM  Result Value Ref Range   Glucose-Capillary 164 (H) 65 - 99 mg/dL   Comment 1 Notify RN    Comment 2 Document in Chart   Glucose, capillary     Status: Abnormal   Collection Time: 01/16/17  4:46 PM  Result Value Ref Range   Glucose-Capillary 147 (H) 65 - 99 mg/dL   Comment 1 Notify RN    Comment 2 Document in Chart   Glucose, capillary     Status: Abnormal   Collection Time: 01/16/17 10:18 PM  Result Value Ref Range   Glucose-Capillary 158 (H) 65 - 99 mg/dL   Comment 1 Notify RN    Comment 2 Document in Chart   CBC with Differential/Platelet     Status: Abnormal   Collection Time: 01/17/17  4:07 AM  Result Value Ref Range   WBC 5.1 4.0 - 10.5 K/uL   RBC 4.26 4.22 - 5.81 MIL/uL   Hemoglobin 10.3 (L) 13.0 - 17.0 g/dL   HCT 32.6 (L) 39.0 - 52.0 %   MCV 76.5 (L) 78.0 - 100.0 fL    MCH 24.2 (L) 26.0 - 34.0 pg   MCHC 31.6 30.0 - 36.0 g/dL   RDW 16.5 (H) 11.5 - 15.5 %   Platelets 211 150 - 400 K/uL   Neutrophils Relative % 54 %   Neutro Abs 2.8 1.7 - 7.7 K/uL   Lymphocytes Relative 30 %   Lymphs Abs 1.5 0.7 - 4.0 K/uL   Monocytes Relative 14 %   Monocytes Absolute 0.7 0.1 - 1.0 K/uL   Eosinophils Relative 2 %   Eosinophils Absolute 0.1 0.0 - 0.7 K/uL   Basophils Relative 0 %   Basophils Absolute 0.0 0.0 - 0.1 K/uL  Basic metabolic panel     Status: Abnormal   Collection Time: 01/17/17  4:07 AM  Result Value Ref Range   Sodium 133 (L) 135 - 145 mmol/L   Potassium 3.3 (L) 3.5 - 5.1 mmol/L   Chloride 98 (L) 101 - 111 mmol/L   CO2 28 22 - 32 mmol/L   Glucose, Bld 111 (H) 65 - 99 mg/dL   BUN 21 (H) 6 - 20 mg/dL   Creatinine, Ser 1.37 (H) 0.61 - 1.24 mg/dL   Calcium 8.1 (L) 8.9 - 10.3 mg/dL   GFR calc non Af Amer 43 (L) >60 mL/min  GFR calc Af Amer 50 (L) >60 mL/min    Comment: (NOTE) The eGFR has been calculated using the CKD EPI equation. This calculation has not been validated in all clinical situations. eGFR's persistently <60 mL/min signify possible Chronic Kidney Disease.    Anion gap 7 5 - 15  Lactate dehydrogenase     Status: None   Collection Time: 01/17/17  4:07 AM  Result Value Ref Range   LDH 138 98 - 192 U/L  Magnesium     Status: Abnormal   Collection Time: 01/17/17  4:07 AM  Result Value Ref Range   Magnesium 1.4 (L) 1.7 - 2.4 mg/dL  Phosphorus     Status: None   Collection Time: 01/17/17  4:07 AM  Result Value Ref Range   Phosphorus 3.9 2.5 - 4.6 mg/dL  Glucose, capillary     Status: Abnormal   Collection Time: 01/17/17  8:05 AM  Result Value Ref Range   Glucose-Capillary 113 (H) 65 - 99 mg/dL  Glucose, capillary     Status: Abnormal   Collection Time: 01/17/17 11:24 AM  Result Value Ref Range   Glucose-Capillary 161 (H) 65 - 99 mg/dL  Glucose, capillary     Status: Abnormal   Collection Time: 01/17/17 11:41 AM  Result Value  Ref Range   Glucose-Capillary 141 (H) 65 - 99 mg/dL  Glucose, capillary     Status: None   Collection Time: 01/17/17  4:07 PM  Result Value Ref Range   Glucose-Capillary 97 65 - 99 mg/dL  Urinalysis, Routine w reflex microscopic     Status: Abnormal   Collection Time: 01/17/17  4:25 PM  Result Value Ref Range   Color, Urine YELLOW YELLOW   APPearance CLOUDY (A) CLEAR   Specific Gravity, Urine 1.019 1.005 - 1.030   pH 6.0 5.0 - 8.0   Glucose, UA NEGATIVE NEGATIVE mg/dL   Hgb urine dipstick NEGATIVE NEGATIVE   Bilirubin Urine NEGATIVE NEGATIVE   Ketones, ur 5 (A) NEGATIVE mg/dL   Protein, ur 100 (A) NEGATIVE mg/dL   Nitrite NEGATIVE NEGATIVE   Leukocytes, UA SMALL (A) NEGATIVE   RBC / HPF 6-30 0 - 5 RBC/hpf   WBC, UA TOO NUMEROUS TO COUNT 0 - 5 WBC/hpf   Bacteria, UA MANY (A) NONE SEEN   Squamous Epithelial / LPF 0-5 (A) NONE SEEN   WBC Clumps PRESENT    Mucus PRESENT    Non Squamous Epithelial 0-5 (A) NONE SEEN  Glucose, capillary     Status: Abnormal   Collection Time: 01/17/17  9:08 PM  Result Value Ref Range   Glucose-Capillary 248 (H) 65 - 99 mg/dL  Glucose, capillary     Status: Abnormal   Collection Time: 01/18/17  7:44 AM  Result Value Ref Range   Glucose-Capillary 148 (H) 65 - 99 mg/dL    ABGS No results for input(s): PHART, PO2ART, TCO2, HCO3 in the last 72 hours.  Invalid input(s): PCO2 CULTURES Recent Results (from the past 240 hour(s))  Acid Fast Smear (AFB)     Status: None   Collection Time: 01/14/17 12:50 PM  Result Value Ref Range Status   AFB Specimen Processing Concentration  Final   Acid Fast Smear Negative  Final    Comment: (NOTE) Performed At: [facility] [address] [place], [place] [id number] Lindon Romp MD TW:[id number]    Source (AFB) PLEURAL  Final  Culture, body fluid-bottle     Status: None (Preliminary result)   Collection Time: 01/14/17  12:50 PM  Result Value Ref Range Status   Specimen Description PLEURAL  COLLECTED BY DOCTOR  Final   Special Requests BOTTLES DRAWN AEROBIC AND ANAEROBIC 10 CC EACH  Final   Culture NO GROWTH 4 DAYS  Final   Report Status PENDING  Incomplete  Gram stain     Status: None   Collection Time: 01/14/17 12:50 PM  Result Value Ref Range Status   Specimen Description PLEURAL  Final   Special Requests NONE  Final   Gram Stain   Final    CYTOSPIN SMEAR NO ORGANISMS SEEN WBC SEEN WBC PRESENT, PREDOMINANTLY MONONUCLEAR    Report Status 01/14/2017 FINAL  Final   Studies/Results: Dg Chest 1 View  Result Date: 01/16/2017 CLINICAL DATA:  Status post right thoracentesis today. EXAM: CHEST 1 VIEW COMPARISON:  CT chest 01/15/2017. Single-view of the chest 01/14/2017. FINDINGS: Right pleural effusion is decreased after thoracentesis. No pneumothorax. Right middle and lower lobe airspace disease identified. Left lung is clear. Heart size is mildly enlarged. IMPRESSION: Negative for pneumothorax after right thoracentesis. Small to moderate residual right pleural effusion with right middle and lower lobe airspace disease. Cardiomegaly without edema. Electronically Signed   By: Inge Rise M.D.   On: 01/16/2017 11:11   Dg Esophagus  Result Date: 01/17/2017 CLINICAL DATA:  Abnormal CT chest exam with wall thickening, fluid and question mass in the proximal thoracic esophagus ; patient denies swallowing difficulties. EXAM: ESOPHOGRAM/BARIUM SWALLOW TECHNIQUE: Single contrast examination was performed using thin barium. Patient also swallowed a 12.5 mm diameter barium tablet. FLUOROSCOPY TIME:  Fluoroscopy Time:  2 minutes 0 seconds Radiation Exposure Index (if provided by the fluoroscopic device): 47.6 mGy Number of Acquired Spot Images: multiple fluoroscopic screen captures COMPARISON:  CT chest 01/15/2017 FINDINGS: Grossly normal esophageal distention without mass or stricture. No persistent intraluminal filling defects. Specifically no abnormalities identified in the thoracic  esophagus at the site of questioned abnormality on CT, presumed to about an artifact. Diffuse age-related esophageal dysmotility. Esophageal wall grossly smooth without irregularity or ulceration. Targeted rapid sequence imaging of the cervical esophagus and hypopharynx showed no laryngeal penetration or aspiration. 12.5 mm diameter barium tablet passed from oral cavity to stomach without obstruction/ delay. IMPRESSION: Age-related esophageal dysmotility. Otherwise negative exam. Electronically Signed   By: Lavonia Dana M.D.   On: 01/17/2017 15:12   US Thoracentesis Asp Pleural Space W/img Guide  Result Date: 01/16/2017 INDICATION: Symptomatic RIGHT sided pleural effusion, prior RIGHT thoracentesis EXAM: US THORACENTESIS ASP PLEURAL SPACE W/IMG GUIDE COMPARISON:  Previous thoracentesis MEDICATIONS: 10 cc 1% lidocaine. COMPLICATIONS: None immediate. TECHNIQUE: Informed written consent was obtained from the patient after a discussion of the risks, benefits and alternatives to treatment. A timeout was performed prior to the initiation of the procedure. Initial ultrasound scanning demonstrates a right pleural effusion. The lower chest was prepped and draped in the usual sterile fashion. 1% lidocaine was used for local anesthesia. Under direct ultrasound guidance, a 19 gauge, 7-cm, Yueh catheter was introduced. An ultrasound image was saved for documentation purposes. The thoracentesis was performed. The catheter was removed and a dressing was applied. The patient tolerated the procedure well without immediate post procedural complication. The patient was escorted to have an upright chest radiograph. FINDINGS: A total of approximately 1 liter of yellow fluid was removed. Requested samples were sent to the laboratory. IMPRESSION: Successful ultrasound-guided R sided thoracentesis yielding 1 liter of pleural fluid. Post procedure CXR:  No PTX per Radiologist Read by Lavonia Drafts PAC  Electronically Signed   By: Lavonia Dana M.D.   On: 01/16/2017 11:14    Medications:  Prior to Admission:  Prescriptions Prior to Admission  Medication Sig Dispense Refill Last Dose  . amLODipine (NORVASC) 10 MG tablet Take 10 mg by mouth daily.   Past Week at Unknown time  . lisinopril (PRINIVIL,ZESTRIL) 40 MG tablet Take 40 mg by mouth daily.   Past Week at Unknown time  . metFORMIN (GLUCOPHAGE) 1000 MG tablet Take 1,000 mg by mouth 2 (two) times daily with a meal.   Past Week at Unknown time  . pravastatin (PRAVACHOL) 40 MG tablet Take 40 mg by mouth daily.   Past Week at Unknown time  . Vitamin D, Ergocalciferol, (DRISDOL) 50000 units CAPS capsule Take 50,000 Units by mouth every 7 (seven) days. wednesday   Past Week at Unknown time  . cephALEXin (KEFLEX) 500 MG capsule Take 1 capsule (500 mg total) by mouth 3 (three) times daily. (Patient not taking: Reported on 01/13/2017) 30 capsule 0 Not Taking at Unknown time   Scheduled: . amLODipine  2.5 mg Oral Daily  . insulin aspart  0-9 Units Subcutaneous TID WC  . lisinopril  5 mg Oral Daily  . mouth rinse  15 mL Mouth Rinse BID   Continuous:  WMG:[id number] **OR** acetaminophen, albuterol, ondansetron **OR** ondansetron (ZOFRAN) IV, polyethylene glycol  Assesment: He has a right pleural effusion status post thoracentesis 2. On the first thoracentesis cytology was negative and cytology is pending on the second.  He has additionally mass lesion in the right lung. This certainly could be cancer. If we can get a diagnosis by thoracentesis without having to do more invasive procedures that would ideal he'll but if not he may need something like a VATS to remove the pleural fluid. He may need a Pleur-evac catheter. Principal Problem:   Pleural effusion on right Active Problems:   Acute respiratory failure (HCC)   HTN (hypertension)   DM II (diabetes mellitus, type II), controlled ([place])   Status post thoracentesis    Plan: As above. He has been considering whether he  wants to do anything more invasive. Perhaps palliative care consultation would be of help    LOS: 4 days   Balen Woolum L 01/18/2017, 8:52 AM

## 2017-01-19 LAB — GLUCOSE, CAPILLARY
GLUCOSE-CAPILLARY: 220 mg/dL — AB (ref 65–99)
GLUCOSE-CAPILLARY: 146 mg/dL — AB (ref 65–99)
Glucose-Capillary: 116 mg/dL — ABNORMAL HIGH (ref 65–99)
Glucose-Capillary: 90 mg/dL (ref 65–99)

## 2017-01-19 LAB — BASIC METABOLIC PANEL
Anion gap: 7 (ref 5–15)
BUN: 22 mg/dL — ABNORMAL HIGH (ref 6–20)
CALCIUM: 8.1 mg/dL — AB (ref 8.9–10.3)
CO2: 27 mmol/L (ref 22–32)
CREATININE: 1.24 mg/dL (ref 0.61–1.24)
Chloride: 99 mmol/L — ABNORMAL LOW (ref 101–111)
GFR calc non Af Amer: 49 mL/min — ABNORMAL LOW (ref >60)
GFR, EST AFRICAN AMERICAN: 57 mL/min — AB (ref >60)
Glucose, Bld: 122 mg/dL — ABNORMAL HIGH (ref 65–99)
Potassium: 3.2 mmol/L — ABNORMAL LOW (ref 3.5–5.1)
SODIUM: 133 mmol/L — AB (ref 135–145)

## 2017-01-19 LAB — CULTURE, BODY FLUID-BOTTLE

## 2017-01-19 LAB — CULTURE, BODY FLUID W GRAM STAIN -BOTTLE: Culture: NO GROWTH

## 2017-01-19 NOTE — Progress Notes (Signed)
PROGRESS NOTE    Robert Gates  QQP:[id number] DOB: 1922/11/21 DOA: 01/13/2017 PCP: System, Pcp Not In    Brief Narrative:  83 year old male who presented with dyspnea. Patient does have significant past medical history of type 2 diabetes mellitus, hypertension, GERD and dyslipidemia. Patient developed worsening dyspnea for last 2 weeks,progressive to the point where he became symptomatic with minimal efforts. On initial physical examination blood pressure 144/94, heart rate 95, respiratory 28-34, oxygen saturation 93% on supplemental oxygen. Moist mucous membranes, lungs with decreased breath sounds on the right, dull to percussion, no wheezing, rhonchi, heart S1-S2 present and rhythmic, no cause, rubs or murmurs, the abdomen was soft nontender, no nausea or edema. Sodium 140, potassium 4.4, chloride 103, bicarbonate 27, glucose 133, creatinine 1.18, white count 6.7, hemoglobin 10.3, hematocrit 32.5, platelets 264. Chest x-ray with a large right pleural effusion with complete opacification of the right hemithorax. CT with large right pleural effusion, likely right lower lobe mass and small right pulmonary nodules withlarge lymph nodes.   Patient was admitted to the hospital with the working diagnosis acute hypoxic respiratory failure due to large right pleural effusion   Assessment & Plan:   Principal Problem:   Pleural effusion on right Active Problems:   Acute respiratory failure (HCC)   HTN (hypertension)   DM II (diabetes mellitus, type II), controlled ([place])   Status post thoracentesis   1. Acute hypoxic failure due to large right exudative pleural effusion. No significant dyspnea, on exam no signs of significant recurrence of pleural effusion on the right. Will follow chest film in am, if reacumulation of pleural effusion will arrange for pleural catheter placement per IR, if not recurrence of fluid will discharge home with radiographic follow up as outpatient. Will continue to  follow Dr Luan Pulling recommendations.    2. Right upper lobe pulmonary nodules. Multiple nodules in the right upper lobe, with the greatestmeasuringapproximately 1.7 x 1.8 cm.  No aggressive management per patient and patient's family. Pending pleural fluid cytology.   2. Hypertension. On amlodipine and lisinopril, with good blood pressure control 118 to 712 mmHg systolic.  3. Type 2 diabetes mellitus. Capillary glucose 175, 92, 147, 116, 220, will continue with insulin sliding scale for glucose cover and monitoring.    4. CKD stage 2. Stable renal function with serum cr at 1,24 and K at 3,2. Serum bicarbonate at 27. Avoid nephrotoxic medications.   5. NEW urinary  tract infection. Will plan to continue 5 days course of antibiotic therapy with cephalexin. Patient afebrile.   DVT prophylaxis:enoxaparin  Code Status:full Family Communication:no family at the bedside Disposition Plan:homoe   Consultants:  Pulmonary  Procedures:  Thoracentesis x2   Subjective: Patient feeling better, positive weakness and deconditioning, no chest pain, no nausea or vomiting.   Objective: Vitals:   01/17/17 2113 01/18/17 0601 01/18/17 2102 01/19/17 0531  BP: 118/62 120/64 (!) 146/84 (!) 131/56  Pulse: 80 74 96 (!) 50  Resp: 20 20 18 18   Temp: 98 F (36.7 C) 98.2 F (36.8 C) 98.5 F (36.9 C) 98.4 F (36.9 C)  TempSrc: Oral Oral Oral Oral  SpO2: 98% 97% 99% 99%  Weight:  88.5 kg (195 lb 1.7 oz)    Height:        Intake/Output Summary (Last 24 hours) at 01/19/17 0913 Last data filed at 01/19/17 0534  Gross per 24 hour  Intake                0 ml  Output  650 ml  Net             -650 ml   Filed Weights   01/13/17 0904 01/13/17 1627 01/18/17 0601  Weight: 88.5 kg (195 lb) 88.5 kg (195 lb) 88.5 kg (195 lb 1.7 oz)    Examination:   General: deconditioned Neurology: Awake and alert, non focal  E ENT: mild pallor, no icterus, oral mucosa  moist Cardiovascular: No JVD. S1-S2 present, rhythmic, no gallops, rubs, or murmurs. No lower extremity edema. Pulmonary: decreased breath sounds bilaterally at bases, adequate air movement, no wheezing, rhonchi, but bibasilar rales. Gastrointestinal. Abdomen flat, no organomegaly, non tender, no rebound or guarding Skin. No rashes Musculoskeletal: no joint deformities     Data Reviewed: I have personally reviewed following labs and imaging studies  CBC:  Recent Labs Lab 01/13/17 0926 01/17/17 0407  WBC 6.7 5.1  NEUTROABS  --  2.8  HGB 10.3* 10.3*  HCT 32.5* 32.6*  MCV 77.4* 76.5*  PLT 264 466   Basic Metabolic Panel:  Recent Labs Lab 01/13/17 0926 01/17/17 0407 01/19/17 0600  NA 140 133* 133*  K 4.4 3.3* 3.2*  CL 103 98* 99*  CO2 27 28 27   GLUCOSE 133* 111* 122*  BUN 18 21* 22*  CREATININE 1.18 1.37* 1.24  CALCIUM 9.2 8.1* 8.1*  MG  --  1.4*  --   PHOS  --  3.9  --    GFR: Estimated Creatinine Clearance: 43.1 mL/min (by C-G formula based on SCr of 1.24 mg/dL). Liver Function Tests:  Recent Labs Lab 01/13/17 0926 01/13/17 1639  AST 16  --   ALT 9*  --   ALKPHOS 65  --   BILITOT 0.3  --   PROT 8.0 7.6  ALBUMIN 3.5 3.3*   No results for input(s): LIPASE, AMYLASE in the last 168 hours. No results for input(s): AMMONIA in the last 168 hours. Coagulation Profile: No results for input(s): INR, PROTIME in the last 168 hours. Cardiac Enzymes:  Recent Labs Lab 01/13/17 0926  TROPONINI <0.03   BNP (last 3 results) No results for input(s): PROBNP in the last 8760 hours. HbA1C: No results for input(s): HGBA1C in the last 72 hours. CBG:  Recent Labs Lab 01/18/17 0744 01/18/17 1140 01/18/17 1615 01/18/17 2057 01/19/17 0801  GLUCAP 148* 175* 92 147* 116*   Lipid Profile: No results for input(s): CHOL, HDL, LDLCALC, TRIG, CHOLHDL, LDLDIRECT in the last 72 hours. Thyroid Function Tests: No results for input(s): TSH, T4TOTAL, FREET4, T3FREE,  THYROIDAB in the last 72 hours. Anemia Panel: No results for input(s): VITAMINB12, FOLATE, FERRITIN, TIBC, IRON, RETICCTPCT in the last 72 hours.    Radiology Studies: I have reviewed all of the imaging during this hospital visit personally     Scheduled Meds: . amLODipine  2.5 mg Oral Daily  . cephALEXin  500 mg Oral Q8H  . insulin aspart  0-9 Units Subcutaneous TID WC  . lisinopril  5 mg Oral Daily  . mouth rinse  15 mL Mouth Rinse BID   Continuous Infusions:   LOS: 5 days        Mauricio [name], MD [facility] Pager [phone]

## 2017-01-19 NOTE — Progress Notes (Signed)
Subjective: He says he feels okay. His breathing is doing well. No new complaints. No chest pain.  Objective: Vital signs in last 24 hours: Temp:  [98.4 F (36.9 C)-98.5 F (36.9 C)] 98.4 F (36.9 C) (10/27 0531) Pulse Rate:  [50-96] 50 (10/27 0531) Resp:  [18] 18 (10/27 0531) BP: (131-146)/(56-84) 131/56 (10/27 0531) SpO2:  [99 %] 99 % (10/27 0531) Weight change:  Last BM Date: 01/18/17  Intake/Output from previous day: 10/26 0701 - 10/27 0700 In: -  Out: 650 [Urine:650]  PHYSICAL EXAM General appearance: alert, cooperative and no distress Resp: He still has somewhat diminished breath sounds on the right Cardio: regular rate and rhythm, S1, S2 normal, no murmur, click, rub or gallop GI: soft, non-tender; bowel sounds normal; no masses,  no organomegaly Extremities: extremities normal, atraumatic, no cyanosis or edema Skin warm and dry  Lab Results:  Results for orders placed or performed during the hospital encounter of 01/13/17 (from the past 48 hour(s))  Glucose, capillary     Status: Abnormal   Collection Time: 01/17/17 11:24 AM  Result Value Ref Range   Glucose-Capillary 161 (H) 65 - 99 mg/dL  Glucose, capillary     Status: Abnormal   Collection Time: 01/17/17 11:41 AM  Result Value Ref Range   Glucose-Capillary 141 (H) 65 - 99 mg/dL  Glucose, capillary     Status: None   Collection Time: 01/17/17  4:07 PM  Result Value Ref Range   Glucose-Capillary 97 65 - 99 mg/dL  Urinalysis, Routine w reflex microscopic     Status: Abnormal   Collection Time: 01/17/17  4:25 PM  Result Value Ref Range   Color, Urine YELLOW YELLOW   APPearance CLOUDY (A) CLEAR   Specific Gravity, Urine 1.019 1.005 - 1.030   pH 6.0 5.0 - 8.0   Glucose, UA NEGATIVE NEGATIVE mg/dL   Hgb urine dipstick NEGATIVE NEGATIVE   Bilirubin Urine NEGATIVE NEGATIVE   Ketones, ur 5 (A) NEGATIVE mg/dL   Protein, ur 100 (A) NEGATIVE mg/dL   Nitrite NEGATIVE NEGATIVE   Leukocytes, UA SMALL (A) NEGATIVE    RBC / HPF 6-30 0 - 5 RBC/hpf   WBC, UA TOO NUMEROUS TO COUNT 0 - 5 WBC/hpf   Bacteria, UA MANY (A) NONE SEEN   Squamous Epithelial / LPF 0-5 (A) NONE SEEN   WBC Clumps PRESENT    Mucus PRESENT    Non Squamous Epithelial 0-5 (A) NONE SEEN  Glucose, capillary     Status: Abnormal   Collection Time: 01/17/17  9:08 PM  Result Value Ref Range   Glucose-Capillary 248 (H) 65 - 99 mg/dL  Glucose, capillary     Status: Abnormal   Collection Time: 01/18/17  7:44 AM  Result Value Ref Range   Glucose-Capillary 148 (H) 65 - 99 mg/dL  Glucose, capillary     Status: Abnormal   Collection Time: 01/18/17 11:40 AM  Result Value Ref Range   Glucose-Capillary 175 (H) 65 - 99 mg/dL  Glucose, capillary     Status: None   Collection Time: 01/18/17  4:15 PM  Result Value Ref Range   Glucose-Capillary 92 65 - 99 mg/dL  Glucose, capillary     Status: Abnormal   Collection Time: 01/18/17  8:57 PM  Result Value Ref Range   Glucose-Capillary 147 (H) 65 - 99 mg/dL   Comment 1 Notify RN    Comment 2 Document in Chart   Basic metabolic panel     Status: Abnormal  Collection Time: 01/19/17  6:00 AM  Result Value Ref Range   Sodium 133 (L) 135 - 145 mmol/L   Potassium 3.2 (L) 3.5 - 5.1 mmol/L   Chloride 99 (L) 101 - 111 mmol/L   CO2 27 22 - 32 mmol/L   Glucose, Bld 122 (H) 65 - 99 mg/dL   BUN 22 (H) 6 - 20 mg/dL   Creatinine, Ser 1.24 0.61 - 1.24 mg/dL   Calcium 8.1 (L) 8.9 - 10.3 mg/dL   GFR calc non Af Amer 49 (L) >60 mL/min   GFR calc Af Amer 57 (L) >60 mL/min    Comment: (NOTE) The eGFR has been calculated using the CKD EPI equation. This calculation has not been validated in all clinical situations. eGFR's persistently <60 mL/min signify possible Chronic Kidney Disease.    Anion gap 7 5 - 15  Glucose, capillary     Status: Abnormal   Collection Time: 01/19/17  8:01 AM  Result Value Ref Range   Glucose-Capillary 116 (H) 65 - 99 mg/dL    ABGS No results for input(s): PHART, PO2ART, TCO2,  HCO3 in the last 72 hours.  Invalid input(s): PCO2 CULTURES Recent Results (from the past 240 hour(s))  Acid Fast Smear (AFB)     Status: None   Collection Time: 01/14/17 12:50 PM  Result Value Ref Range Status   AFB Specimen Processing Concentration  Final   Acid Fast Smear Negative  Final    Comment: (NOTE) Performed At: [facility] [address] [place], [place] [id number] Lindon Romp MD HR:[id number]    Source (AFB) PLEURAL  Final  Culture, body fluid-bottle     Status: None   Collection Time: 01/14/17 12:50 PM  Result Value Ref Range Status   Specimen Description PLEURAL COLLECTED BY DOCTOR  Final   Special Requests BOTTLES DRAWN AEROBIC AND ANAEROBIC 10 CC EACH  Final   Culture NO GROWTH 5 DAYS  Final   Report Status 01/19/2017 FINAL  Final  Gram stain     Status: None   Collection Time: 01/14/17 12:50 PM  Result Value Ref Range Status   Specimen Description PLEURAL  Final   Special Requests NONE  Final   Gram Stain   Final    CYTOSPIN SMEAR NO ORGANISMS SEEN WBC SEEN WBC PRESENT, PREDOMINANTLY MONONUCLEAR    Report Status 01/14/2017 FINAL  Final   Studies/Results: Dg Chest 2 View  Result Date: 01/18/2017 CLINICAL DATA:  Pleural effusion EXAM: CHEST  2 VIEW COMPARISON:  January 16, 2017 FINDINGS: There is a persistent right pleural effusion with patchy atelectatic change in the right lower lobe. There is a minimal left pleural effusion with slight left base atelectatic change. Upper lung zones are clear. Heart is upper normal in size with pulmonary vascularity within normal limits. There is aortic atherosclerosis. No adenopathy. No evident bone lesions. IMPRESSION: Pleural effusions bilaterally, larger on the right than on the left. Moderate atelectatic change in the right lower lobe. Patchy infiltrate may well be present superimposed in the right base region. There is slight left base atelectasis. Cardiac silhouette is stable.  There is aortic  atherosclerosis. Aortic Atherosclerosis (ICD10-I70.0). Electronically Signed   By: Lowella Grip III M.D.   On: 01/18/2017 10:00   Dg Esophagus  Result Date: 01/17/2017 CLINICAL DATA:  Abnormal CT chest exam with wall thickening, fluid and question mass in the proximal thoracic esophagus ; patient denies swallowing difficulties. EXAM: ESOPHOGRAM/BARIUM SWALLOW TECHNIQUE: Single contrast examination was performed using thin barium.  Patient also swallowed a 12.5 mm diameter barium tablet. FLUOROSCOPY TIME:  Fluoroscopy Time:  2 minutes 0 seconds Radiation Exposure Index (if provided by the fluoroscopic device): 47.6 mGy Number of Acquired Spot Images: multiple fluoroscopic screen captures COMPARISON:  CT chest 01/15/2017 FINDINGS: Grossly normal esophageal distention without mass or stricture. No persistent intraluminal filling defects. Specifically no abnormalities identified in the thoracic esophagus at the site of questioned abnormality on CT, presumed to about an artifact. Diffuse age-related esophageal dysmotility. Esophageal wall grossly smooth without irregularity or ulceration. Targeted rapid sequence imaging of the cervical esophagus and hypopharynx showed no laryngeal penetration or aspiration. 12.5 mm diameter barium tablet passed from oral cavity to stomach without obstruction/ delay. IMPRESSION: Age-related esophageal dysmotility. Otherwise negative exam. Electronically Signed   By: Lavonia Dana M.D.   On: 01/17/2017 15:12    Medications:  Prior to Admission:  Prescriptions Prior to Admission  Medication Sig Dispense Refill Last Dose  . amLODipine (NORVASC) 10 MG tablet Take 10 mg by mouth daily.   Past Week at Unknown time  . lisinopril (PRINIVIL,ZESTRIL) 40 MG tablet Take 40 mg by mouth daily.   Past Week at Unknown time  . metFORMIN (GLUCOPHAGE) 1000 MG tablet Take 1,000 mg by mouth 2 (two) times daily with a meal.   Past Week at Unknown time  . pravastatin (PRAVACHOL) 40 MG tablet  Take 40 mg by mouth daily.   Past Week at Unknown time  . Vitamin D, Ergocalciferol, (DRISDOL) 50000 units CAPS capsule Take 50,000 Units by mouth every 7 (seven) days. wednesday   Past Week at Unknown time  . cephALEXin (KEFLEX) 500 MG capsule Take 1 capsule (500 mg total) by mouth 3 (three) times daily. (Patient not taking: Reported on 01/13/2017) 30 capsule 0 Not Taking at Unknown time   Scheduled: . amLODipine  2.5 mg Oral Daily  . cephALEXin  500 mg Oral Q8H  . insulin aspart  0-9 Units Subcutaneous TID WC  . lisinopril  5 mg Oral Daily  . mouth rinse  15 mL Mouth Rinse BID   Continuous:  WJX:[id number] **OR** acetaminophen, albuterol, ondansetron **OR** ondansetron (ZOFRAN) IV, polyethylene glycol  Assesment: He has a right pleural effusion. He has mass lesion in the right lung as well. I had discussed with his son by phone yesterday and Dr. Cathlean Sauer discussed in person yesterday with the son and the patient and all agree to be conservative with treatment and not progress to more invasive treatments. Principal Problem:   Pleural effusion on right Active Problems:   Acute respiratory failure (HCC)   HTN (hypertension)   DM II (diabetes mellitus, type II), controlled ([place])   Status post thoracentesis    Plan: Chest x-ray tomorrow. If he is reaccumulating pleural effusion I think he may need a Pleurx catheter that could be placed by interventional radiology and I can follow as an outpatient if desired.    LOS: 5 days   Drayden Lukas L 01/19/2017, 9:18 AM

## 2017-01-20 ENCOUNTER — Inpatient Hospital Stay (HOSPITAL_COMMUNITY): Payer: Medicare Other

## 2017-01-20 LAB — GLUCOSE, CAPILLARY
GLUCOSE-CAPILLARY: 112 mg/dL — AB (ref 65–99)
Glucose-Capillary: 184 mg/dL — ABNORMAL HIGH (ref 65–99)
Glucose-Capillary: 156 mg/dL — ABNORMAL HIGH (ref 65–99)
Glucose-Capillary: 152 mg/dL — ABNORMAL HIGH (ref 65–99)

## 2017-01-20 NOTE — Progress Notes (Addendum)
PROGRESS NOTE    Robert Gates  GDJ:[id number] DOB: 15-Nov-1922 DOA: 01/13/2017 PCP: System, Pcp Not In    Brief Narrative:  83 year old male who presented with dyspnea. Patient does have significant past medical history of type 2 diabetes mellitus, hypertension, GERD and dyslipidemia. Patient developed worsening dyspnea for last 2 weeks,progressive to the point where he became symptomatic with minimal efforts. On initial physical examination blood pressure 144/94, heart rate 95, respiratory 28-34, oxygen saturation 93% on supplemental oxygen. Moist mucous membranes, lungs with decreased breath sounds on the right, dull to percussion, no wheezing, rhonchi, heart S1-S2 present and rhythmic, no cause, rubs or murmurs, the abdomen was soft nontender, no nausea or edema. Sodium 140, potassium 4.4, chloride 103, bicarbonate 27, glucose 133, creatinine 1.18, white count 6.7, hemoglobin 10.3, hematocrit 32.5, platelets 264. Chest x-ray with a large right pleural effusion with complete opacification of the right hemithorax. CT with large right pleural effusion, likely right lower lobe mass and small right pulmonary nodules withlarge lymph nodes.   Patient was admitted to the hospital with the working diagnosis acute hypoxic respiratory failure due to large right pleural effusion   Assessment & Plan:   Principal Problem:   Pleural effusion on right Active Problems:   Acute respiratory failure (HCC)   HTN (hypertension)   DM II (diabetes mellitus, type II), controlled ([place])   Status post thoracentesis   1. Acute hypoxic failure due to large right exudative pleural effusion. Follow up chest film personally reviewed noted moderate plural effusion on the right, patient with no significant dyspnea, oxymetry monitoring at 100 % on 3 LPM, of supplemental oxygen per [place]. Considering stability of the pleural effusion will arrange for outpatient pleural catheter placement per interventional radiology.    2.  Right upper lobe pulmonary nodules. Multiple nodules in the right upper lobe, with the greatestmeasuringapproximately 1.7 x 1.8 cm.Continue supportive medical care, palliative care services have been consulted. Will follow as out patient. No aggressive interventions.   2. Hypertension. Continue  amlodipine and lisinopril, with good toleration.   3. Type 2 diabetes mellitus. Capillary glucose 90, 146, 112, 184, on insulin sliding scale for glucose cover and monitoring. Tolerating well po.   4. CKD stage 2. Close follow up of renal function as outpatient.    5. Urinary tract infection. On cephalexin, #2/5. No significant symptoms.  DVT prophylaxis:enoxaparin  Code Status:full Family Communication:I spoke with patient's son at the bedside and all questions were addressed. Disposition Plan:Patient very deconditioned and weak, will have physical therapy re-evaluation, may need SNF.    Consultants:  Pulmonary  Procedures:  Thoracentesis x2   Subjective: Patient feeling better, dyspnea is stable, no chest pain, no nausea or vomiting, noted to be very weak and deconditioned.   Objective: Vitals:   01/19/17 0531 01/19/17 1400 01/19/17 2221 01/20/17 0540  BP: (!) 131/56 (!) 132/49 (!) 117/54 (!) 119/52  Pulse: (!) 50 (!) 49 85 83  Resp: 18  18 18   Temp: 98.4 F (36.9 C) 98.8 F (37.1 C) 99 F (37.2 C) 98.2 F (36.8 C)  TempSrc: Oral Oral Oral Oral  SpO2: 99%  100% 100%  Weight:      Height:        Intake/Output Summary (Last 24 hours) at 01/20/17 1103 Last data filed at 01/20/17 0900  Gross per 24 hour  Intake              340 ml  Output  800 ml  Net             -460 ml   Filed Weights   01/13/17 0904 01/13/17 1627 01/18/17 0601  Weight: 88.5 kg (195 lb) 88.5 kg (195 lb) 88.5 kg (195 lb 1.7 oz)    Examination:   General: Not in pain or dyspnea, deconditioned Neurology: Awake and alert, non focal  E ENT: mild pallor, no icterus, oral  mucosa moist Cardiovascular: No JVD. S1-S2 present, rhythmic, no gallops, rubs, or murmurs. Trace lower extremity edema. Pulmonary: decreased breath sounds at the right base, adequate air movement at the apical zones bilaterally, no wheezing, rhonchi, positive bilateral rales ay bases. Gastrointestinal. Abdomen flat, no organomegaly, non tender, no rebound or guarding Skin. No rashes Musculoskeletal: no joint deformities     Data Reviewed: I have personally reviewed following labs and imaging studies  CBC:  Recent Labs Lab 01/17/17 0407  WBC 5.1  NEUTROABS 2.8  HGB 10.3*  HCT 32.6*  MCV 76.5*  PLT 643   Basic Metabolic Panel:  Recent Labs Lab 01/17/17 0407 01/19/17 0600  NA 133* 133*  K 3.3* 3.2*  CL 98* 99*  CO2 28 27  GLUCOSE 111* 122*  BUN 21* 22*  CREATININE 1.37* 1.24  CALCIUM 8.1* 8.1*  MG 1.4*  --   PHOS 3.9  --    GFR: Estimated Creatinine Clearance: 43.1 mL/min (by C-G formula based on SCr of 1.24 mg/dL). Liver Function Tests:  Recent Labs Lab 01/13/17 1639  PROT 7.6  ALBUMIN 3.3*   No results for input(s): LIPASE, AMYLASE in the last 168 hours. No results for input(s): AMMONIA in the last 168 hours. Coagulation Profile: No results for input(s): INR, PROTIME in the last 168 hours. Cardiac Enzymes: No results for input(s): CKTOTAL, CKMB, CKMBINDEX, TROPONINI in the last 168 hours. BNP (last 3 results) No results for input(s): PROBNP in the last 8760 hours. HbA1C: No results for input(s): HGBA1C in the last 72 hours. CBG:  Recent Labs Lab 01/19/17 0801 01/19/17 1149 01/19/17 1649 01/19/17 2225 01/20/17 0726  GLUCAP 116* 220* 90 146* 112*   Lipid Profile: No results for input(s): CHOL, HDL, LDLCALC, TRIG, CHOLHDL, LDLDIRECT in the last 72 hours. Thyroid Function Tests: No results for input(s): TSH, T4TOTAL, FREET4, T3FREE, THYROIDAB in the last 72 hours. Anemia Panel: No results for input(s): VITAMINB12, FOLATE, FERRITIN, TIBC, IRON,  RETICCTPCT in the last 72 hours.    Radiology Studies: I have reviewed all of the imaging during this hospital visit personally     Scheduled Meds: . amLODipine  2.5 mg Oral Daily  . cephALEXin  500 mg Oral Q8H  . insulin aspart  0-9 Units Subcutaneous TID WC  . lisinopril  5 mg Oral Daily  . mouth rinse  15 mL Mouth Rinse BID   Continuous Infusions:   LOS: 6 days        Mauricio [name], MD [facility] Pager [phone]

## 2017-01-20 NOTE — Progress Notes (Signed)
He has had his chest x-ray this morning and he is not significantly changed from the last one of 01/18/2017. I have personally reviewed this film.  Discussed his situation with his sonDiscussed his situation with his son who feels that his father is going to need skilled care facility placement placement and he hopes that he can get some help from the [place] for that.  I would plan to continue with current treatments.  I do not think based on the film from today that he definitely needs a Pleur-evac. I do not think based on the film from today tha But he may need in the future.

## 2017-01-21 LAB — GLUCOSE, CAPILLARY
GLUCOSE-CAPILLARY: 142 mg/dL — AB (ref 65–99)
Glucose-Capillary: 117 mg/dL — ABNORMAL HIGH (ref 65–99)
Glucose-Capillary: 190 mg/dL — ABNORMAL HIGH (ref 65–99)
Glucose-Capillary: 119 mg/dL — ABNORMAL HIGH (ref 65–99)

## 2017-01-21 NOTE — Progress Notes (Signed)
IR aware of request for PleurX catheter.  [facility] will arrange for [facility] to bring the patient down tomorrow at 1100 for a 12pm PleurX catheter placement.  Patient is consentable per RN.  Instructions have been discussed with RN as well.  Robert Gates E 12:00 PM 01/21/2017

## 2017-01-21 NOTE — Progress Notes (Signed)
He appears to be approaching discharge. Agree with plans for outpatient pleurovac. catheter. 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 I will plan to sign off at this point. Thanks for allowing me to see him with you

## 2017-01-21 NOTE — Progress Notes (Signed)
PROGRESS NOTE    Robert Gates  WPY:[id number] DOB: [birth year] DOA: 01/13/2017 PCP: System, Pcp Not In    Brief Narrative:  83 year old male who presented with dyspnea. Patient does have significant past medical history of type 2 diabetes mellitus, hypertension, GERD and dyslipidemia. Patient developed worsening dyspnea for last 2 weeks,progressive to the point where he became symptomatic with minimal efforts. On initial physical examination blood pressure 144/94, heart rate 95, respiratory 28-34, oxygen saturation 93% on supplemental oxygen. Moist mucous membranes, lungs with decreased breath sounds on the right, dull to percussion, no wheezing, rhonchi, heart S1-S2 present and rhythmic, no cause, rubs or murmurs, the abdomen was soft nontender, no nausea or edema. Sodium 140, potassium 4.4, chloride 103, bicarbonate 27, glucose 133, creatinine 1.18, white count 6.7, hemoglobin 10.3, hematocrit 32.5, platelets 264. Chest x-ray with a large right pleural effusion with complete opacification of the right hemithorax. CT with large right pleural effusion, likely right lower lobe mass and small right pulmonary nodules withlarge lymph nodes.   Patient was admitted to the hospital with the working diagnosis acute hypoxic respiratory failure due to large right pleural effusion   Assessment & Plan:   Principal Problem:   Pleural effusion on right Active Problems:   Acute respiratory failure (HCC)   HTN (hypertension)   DM II (diabetes mellitus, type II), controlled ([place])   Status post thoracentesis    1. Acute hypoxic failure due to large right exudative pleural effusion. 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 Continue oxymetry monitoring and supplemental 02 per [place] to target 02 saturation greater than 88%.   2. Right upper lobe pulmonary nodules. Multiple  nodules in the right upper lobe, with the greatestmeasuringapproximately 1.7 x 1.8 cm.No aggressive measures, will plant to continue follow up as outpatient.   2. Hypertension. On amlodipine and lisinopril, with good toleration.   3. Type 2 diabetes mellitus. Capillary glucose 184, 156, 152, 117, 190. Will continue sliding scale for glucose cover and monitoring.  4. CKD stage 2. Avoid nephrotoxic medications.   5. Urinary tract infection. On cephalexin, #3/5. With good toleration.   DVT prophylaxis:enoxaparin  Code Status:full Family Communication:No family at the bedside Disposition Plan:Patient very deconditioned and weak, will have physical therapy re-evaluation, may need SNF.    Consultants:  Pulmonary  Procedures:  Thoracentesis x2   Subjective: Patient feeling well, dyspnea stable and not worsening feeling very weak and deconditioned, no nausea or vomiting.   Objective: Vitals:   01/20/17 1500 01/20/17 1953 01/21/17 0541 01/21/17 0926  BP: (!) 118/50 (!) 114/48 (!) 129/56 (!) 121/43  Pulse: 91 90 81 85  Resp: 16 18 18    Temp: 99.1 F (37.3 C) 99.4 F (37.4 C) 98.7 F (37.1 C)   TempSrc: Oral Oral Oral   SpO2: 99% 98% 99%   Weight:      Height:        Intake/Output Summary (Last 24 hours) at 01/21/17 0959 Last data filed at 01/21/17 0546  Gross per 24 hour  Intake              480 ml  Output              675 ml  Net             -195 ml   Filed Weights   01/13/17 0904 01/13/17 1627 01/18/17 0601  Weight: 88.5 kg (  195 lb) 88.5 kg (195 lb) 88.5 kg (195 lb 1.7 oz)    Examination:   General: deconditioned Neurology: Awake and alert, non focal  E ENT: mild pallor, no icterus, oral mucosa moist Cardiovascular: No JVD. S1-S2 present, rhythmic, no gallops, rubs, or murmurs. No lower extremity edema. Pulmonary: decreased breath sounds at the right base, adequate air movement, no wheezing, rhonchi. Positive bibasilar  rales. Gastrointestinal. Abdomen flat, no organomegaly, non tender, no rebound or guarding Skin. No rashes Musculoskeletal: no joint deformities     Data Reviewed: I have personally reviewed following labs and imaging studies  CBC:  Recent Labs Lab 01/17/17 0407  WBC 5.1  NEUTROABS 2.8  HGB 10.3*  HCT 32.6*  MCV 76.5*  PLT 161   Basic Metabolic Panel:  Recent Labs Lab 01/17/17 0407 01/19/17 0600  NA 133* 133*  K 3.3* 3.2*  CL 98* 99*  CO2 28 27  GLUCOSE 111* 122*  BUN 21* 22*  CREATININE 1.37* 1.24  CALCIUM 8.1* 8.1*  MG 1.4*  --   PHOS 3.9  --    GFR: Estimated Creatinine Clearance: 43.1 mL/min (by C-G formula based on SCr of 1.24 mg/dL). Liver Function Tests: No results for input(s): AST, ALT, ALKPHOS, BILITOT, PROT, ALBUMIN in the last 168 hours. No results for input(s): LIPASE, AMYLASE in the last 168 hours. No results for input(s): AMMONIA in the last 168 hours. Coagulation Profile: No results for input(s): INR, PROTIME in the last 168 hours. Cardiac Enzymes: No results for input(s): CKTOTAL, CKMB, CKMBINDEX, TROPONINI in the last 168 hours. BNP (last 3 results) No results for input(s): PROBNP in the last 8760 hours. HbA1C: No results for input(s): HGBA1C in the last 72 hours. CBG:  Recent Labs Lab 01/20/17 0726 01/20/17 1127 01/20/17 1624 01/20/17 1957 01/21/17 0740  GLUCAP 112* 184* 156* 152* 117*   Lipid Profile: No results for input(s): CHOL, HDL, LDLCALC, TRIG, CHOLHDL, LDLDIRECT in the last 72 hours. Thyroid Function Tests: No results for input(s): TSH, T4TOTAL, FREET4, T3FREE, THYROIDAB in the last 72 hours. Anemia Panel: No results for input(s): VITAMINB12, FOLATE, FERRITIN, TIBC, IRON, RETICCTPCT in the last 72 hours.    Radiology Studies: I have reviewed all of the imaging during this hospital visit personally     Scheduled Meds: . amLODipine  2.5 mg Oral Daily  . cephALEXin  500 mg Oral Q8H  . insulin aspart  0-9 Units  Subcutaneous TID WC  . lisinopril  5 mg Oral Daily  . mouth rinse  15 mL Mouth Rinse BID   Continuous Infusions:   LOS: 7 days        Mauricio [name], MD [facility] Pager [phone]

## 2017-01-21 NOTE — Evaluation (Addendum)
Physical Therapy Re-Evaluation Patient Details Name: Robert Gates MRN: [id number] DOB: September 13, 1922 Today's Date: 01/21/2017   History of Present Illness  83 year old male who presented with dyspnea. Patient does have significant past medical history of type 2 diabetes mellitus, hypertension, GERD and dyslipidemia. Patient developed worsening dyspnea for last 2 weeks,progressive to the point where he became symptomatic with minimal efforts. On initial physical examination blood pressure 144/94, heart rate 95, respiratory 28-34, oxygen saturation 93% on supplemental oxygen. Moist mucous membranes, lungs with decreased breath sounds on the right, dull to percussion, no wheezing, rhonchi, heart S1-S2 present and rhythmic, no cause, rubs or murmurs, the abdomen was soft nontender, no nausea or edema. Sodium 140, potassium 4.4, chloride 103, bicarbonate 27, glucose 133, creatinine 1.18, white count 6.7, hemoglobin 10.3, hematocrit 32.5, platelets 264. Chest x-ray with a large right pleural effusion with complete opacification of the right hemithorax. CT with large right pleural effusion, likely right lower lobe mass and small right pulmonary nodules withlarge lymph nodes.      Clinical Impression  Robert Gates is a 83 y.o. male presenting with impaired mobility secondary to discomfort following right thoracentesis. Patient is motivated to participate in therapy and wishes to get stronger to return home. Robert Gates is functioning below his baseline of independent with a single point cane and currently requires minimal assistance for bed mobility, transfers, and gait with  use of front wheeled walker. He will benefit from continued skilled PT at below stated inpatient setting to improve functional independence and address the following impairments: decreased activity tolerance, impaired balance, decreased cardiopulmonary endurance, and decreased strength. Acute PT will follow throughout stay to address current  functional mobility.    Follow Up Recommendations SNF    Equipment Recommendations       Recommendations for Other Services       Precautions / Restrictions Precautions Precautions: Fall Restrictions Weight Bearing Restrictions: No      Mobility  Bed Mobility Overal bed mobility: Needs Assistance Bed Mobility: Rolling;Supine to Sit Rolling: Min assist   Supine to sit: Min assist     General bed mobility comments: patient required verbal and tactile cues with HOB elevated  Transfers Overall transfer level: Needs assistance Equipment used: Rolling walker (2 wheeled) Transfers: Sit to/from [facility] Sit to Stand: Min assist Stand pivot transfers: Min guard   General transfer comment: tactile cues and assistance to initiate sit<>stand transfer from edge of bed, bedside chair, and toilet with use of front wheeled walker  Ambulation/Gait Ambulation/Gait assistance: Min guard Ambulation Distance (Feet): 50 Feet Assistive device: Rolling walker (2 wheeled) Gait Pattern/deviations: Decreased stride length    General Gait Details: 2 bouts x 8 feet, 1 bouts x 50 feet ambulation with front wheeled walker and min guard. Overall patient with wide base of support and decreased step length bilaterally, wiht overall slow gait velocity, no overt LOB throughout gait      Balance Overall balance assessment: Needs assistance Sitting-balance support: Feet supported;No upper extremity supported Sitting balance-Leahy Scale: Good     Standing balance support: Bilateral upper extremity supported Standing balance-Leahy Scale: Fair Standing balance comment: patient requires min guard during static/dynamic standing activities due to unsteadiness      Pertinent Vitals/Pain Pain Assessment: No/denies pain    Home Living Family/patient expects to be discharged to:: Private residence Living Arrangements: Alone Available Help at Discharge: Family Type of Home:  House Home Access: Stairs to enter Entrance Stairs-Rails: None Entrance Stairs-Number of Steps: 2 in the front,  5 in the back, no railings Home Layout: One level Home Equipment: Cane - single point      Prior Function Level of Independence: Independent     Comments: independent for all ADL's, driving throughout the community, community ambulator in the grocery store, ambulates with a single point cane, able to care for 15+ chickens in his backyard     Hand Dominance    Right    Extremity/Trunk Assessment   Upper Extremity Assessment Upper Extremity Assessment: Overall WFL for tasks assessed    Lower Extremity Assessment Lower Extremity Assessment: Overall WFL for tasks assessed    Cervical / Trunk Assessment Cervical / Trunk Assessment: Normal  Communication   Communication: No difficulties  Cognition Arousal/Alertness: Awake/alert Behavior During Therapy: WFL for tasks assessed/performed Overall Cognitive Status: Within Functional Limits for tasks assessed     General Comments: oriented x4, pleasant demeanor             Assessment/Plan    PT Assessment Patient needs continued PT services  PT Problem List Decreased activity tolerance;Decreased mobility;Decreased balance;Decreased strength;Decreased knowledge of use of DME       PT Treatment Interventions Stair training;Gait training;Functional mobility training;Therapeutic activities;Therapeutic exercise;Patient/family education;DME instruction;Balance training    PT Goals (Current goals can be found in the Care Plan section)  Acute Rehab PT Goals Patient Stated Goal: return home to take care of chickens PT Goal Formulation: With patient Time For Goal Achievement: 01/25/17 Potential to Achieve Goals: Fair    Frequency Min 3X/week   Barriers to discharge           AM-PAC PT "6 Clicks" Daily Activity  Outcome Measure Difficulty turning over in bed (including adjusting bedclothes, sheets and  blankets)?: Unable Difficulty moving from lying on back to sitting on the side of the bed? : Unable Difficulty sitting down on and standing up from a chair with arms (e.g., wheelchair, bedside commode, etc,.)?: Unable Help needed moving to and from a bed to chair (including a wheelchair)?: A Little Help needed walking in hospital room?: A Little Help needed climbing 3-5 steps with a railing? : A Lot 6 Click Score: 11    End of Session Equipment Utilized During Treatment: Gait belt (rolling walker) Activity Tolerance: Patient tolerated treatment well Patient left: in chair;with call bell/phone within reach;with nursing/sitter in room Nurse Communication: Mobility status PT Visit Diagnosis: Other abnormalities of gait and mobility (R26.89);Unsteadiness on feet (R26.81);Other (comment);Muscle weakness (generalized) (M62.81);Difficulty in walking, not elsewhere classified (R26.2)    Time: 1130-1210 PT Time Calculation (min) (ACUTE ONLY): 40 min   Charges:   PT Evaluation $PT Eval Moderate Complexity: 1 Mod PT Treatments $Therapeutic Activity: 23-37 mins   PT G Codes:   PT G-Codes **NOT FOR INPATIENT CLASS** Functional Assessment Tool Used: AM-PAC 6 Clicks Basic Mobility Functional Limitation: Mobility: Walking and moving around;Changing and maintaining body position Mobility: Walking and Moving Around Current Status [phone]): At least 60 percent but less than 80 percent impaired, limited or restricted Mobility: Walking and Moving Around Goal Status [phone]): At least 60 percent but less than 80 percent impaired, limited or restricted Mobility: Walking and Moving Around Discharge Status [phone]): At least 60 percent but less than 80 percent impaired, limited or restricted Changing and Maintaining Body Position Current Status (O1308): At least 60 percent but less than 80 percent impaired, limited or restricted Changing and Maintaining Body Position Goal Status (M5784): At least 60 percent but  less than 80 percent impaired, limited or restricted Changing and  Maintaining Body Position Discharge Status [phone]): At least 60 percent but less than 80 percent impaired, limited or restricted    Debara Pickett, PT, DPT Physical Therapist with [facility]  01/21/2017 1:57 PM

## 2017-01-22 ENCOUNTER — Encounter (HOSPITAL_COMMUNITY): Payer: Self-pay

## 2017-01-22 ENCOUNTER — Ambulatory Visit (HOSPITAL_COMMUNITY)
Admission: RE | Admit: 2017-01-22 | Discharge: 2017-01-22 | Disposition: A | Discharge status: Home / Self Care | Payer: Medicare Other | Source: Ambulatory Visit | Attending: Internal Medicine | Admitting: Internal Medicine

## 2017-01-22 DIAGNOSIS — E785 Hyperlipidemia, unspecified: Secondary | ICD-10-CM | POA: Insufficient documentation

## 2017-01-22 DIAGNOSIS — E119 Type 2 diabetes mellitus without complications: Secondary | ICD-10-CM

## 2017-01-22 DIAGNOSIS — I10 Essential (primary) hypertension: Secondary | ICD-10-CM

## 2017-01-22 DIAGNOSIS — J910 Malignant pleural effusion: Secondary | ICD-10-CM | POA: Insufficient documentation

## 2017-01-22 DIAGNOSIS — K219 Gastro-esophageal reflux disease without esophagitis: Secondary | ICD-10-CM | POA: Insufficient documentation

## 2017-01-22 HISTORY — PX: IR US GUIDE BX ASP/DRAIN: IMG2392

## 2017-01-22 HISTORY — PX: IR GUIDED DRAIN W CATHETER PLACEMENT: IMG719

## 2017-01-22 LAB — GLUCOSE, CAPILLARY
GLUCOSE-CAPILLARY: 129 mg/dL — AB (ref 65–99)
GLUCOSE-CAPILLARY: 141 mg/dL — AB (ref 65–99)
Glucose-Capillary: 156 mg/dL — ABNORMAL HIGH (ref 65–99)

## 2017-01-22 LAB — PROTIME-INR
INR: 1.07
PROTHROMBIN TIME: 13.8 s (ref 11.4–15.2)

## 2017-01-22 MED ORDER — FENTANYL CITRATE (PF) 100 MCG/2ML IJ SOLN
INTRAMUSCULAR | Status: AC
  Filled 2017-01-22: qty 2

## 2017-01-22 MED ORDER — CEFAZOLIN SODIUM-DEXTROSE 2-4 GM/100ML-% IV SOLN
INTRAVENOUS | Status: AC
  Administered 2017-01-22: 2000 mg
  Filled 2017-01-22: qty 100

## 2017-01-22 MED ORDER — LIDOCAINE HCL 1 % IJ SOLN
INTRAMUSCULAR | Status: AC
  Filled 2017-01-22: qty 20

## 2017-01-22 MED ORDER — MIDAZOLAM HCL 2 MG/2ML IJ SOLN
INTRAMUSCULAR | Status: AC | PRN
  Administered 2017-01-22: 0.5 mg via INTRAVENOUS
  Administered 2017-01-22: 1 mg via INTRAVENOUS

## 2017-01-22 MED ORDER — MIDAZOLAM HCL 2 MG/2ML IJ SOLN
INTRAMUSCULAR | Status: AC
  Filled 2017-01-22: qty 2

## 2017-01-22 MED ORDER — CEFAZOLIN SODIUM-DEXTROSE 2-4 GM/100ML-% IV SOLN: 2.0000 g | Freq: Once | INTRAVENOUS | Status: DC

## 2017-01-22 MED ORDER — GELATIN ABSORBABLE 12-7 MM EX MISC
CUTANEOUS | Status: AC
  Filled 2017-01-22: qty 1

## 2017-01-22 MED ORDER — GUAIFENESIN 100 MG/5ML PO SOLN: 5.0000 mL | ORAL | Status: DC | PRN

## 2017-01-22 MED ORDER — FENTANYL CITRATE (PF) 100 MCG/2ML IJ SOLN
INTRAMUSCULAR | Status: AC | PRN
  Administered 2017-01-22 (×2): 25 ug via INTRAVENOUS

## 2017-01-22 MED ORDER — CEFAZOLIN SODIUM-DEXTROSE 2-4 GM/100ML-% IV SOLN
2.0000 g | Freq: Three times a day (TID) | INTRAVENOUS | Status: DC
  Administered 2017-01-22 – 2017-01-23 (×3): 2 g via INTRAVENOUS
  Filled 2017-01-22 (×7): qty 100

## 2017-01-22 MED ORDER — SODIUM CHLORIDE 0.9 % IV SOLN
INTRAVENOUS | Status: AC | PRN
  Administered 2017-01-22: 10 mL/h via INTRAVENOUS

## 2017-01-22 NOTE — Clinical Social Work Note (Signed)
Clinical Social Work Assessment  Patient Details  Name: Robert Gates MRN: [id number] Date of Birth: 01-Aug-1922  Date of referral:  01/22/17               Reason for consult:  Facility Placement                Permission sought to share information with:    Permission granted to share information::     Name::        Agency::     Relationship::     Contact Information:  sons  Housing/Transportation Living arrangements for the past 2 months:  Single Family Home Source of Information:  Adult Children Patient Interpreter Needed:  None Criminal Activity/Legal Involvement Pertinent to Current Situation/Hospitalization:  No - Comment as needed Significant Relationships:  Adult Children Lives with:  Self Do you feel safe going back to the place where you live?  Yes Need for family participation in patient care:  Yes (Comment)  Care giving concerns:  None identified at baseline.    Social Worker assessment / plan:  Patient lives alone, ambulates with a cane and is independent in his ADLs at baseline. Family is agreeable to short term SNF. Patient had a pleurex catheter placed today.    Employment status:  Retired Nurse, adult PT Recommendations:  [place] / Referral to community resources:  [place]  Patient/Family's Response to care:  Patient's family are agreeable to SNF.   Patient/Family's Understanding of and Emotional Response to Diagnosis, Current Treatment, and Prognosis:  Patient's family understand patient's diagnosis, treatment and prognosis and feel that patient will benefit from short-term SNF.  Emotional Assessment Appearance:  Appears stated age Attitude/Demeanor/Rapport:    Affect (typically observed):  Unable to Assess Orientation:  Oriented to Situation, Oriented to  Time, Oriented to Place, Oriented to Self Alcohol / Substance use:  Not Applicable Psych involvement (Current and /or in the  community):  No (Comment)  Discharge Needs  Concerns to be addressed:  No discharge needs identified Readmission within the last 30 days:  No Current discharge risk:  None Barriers to Discharge:  No Barriers Identified   Ihor Gully, LCSW 01/22/2017, 2:29 PM

## 2017-01-22 NOTE — Consult Note (Signed)
Chief Complaint: malignant right pleural effusion  Referring Physician:Dr. Lurline Del  Supervising Physician: Daryll Brod  Patient Status: [facility], inpt  HPI: Robert Gates is a 83 y.o. male who has recently been diagnosed with  What appears to be lung cancer on the right side.  He has had 2 thoracenteses done with greater than 1L of fluid removed within 48 hrs of each other.  The patient is going to be discharged to SNF soon and a request has been made for a pleurX catheter.  Past Medical History:  Past Medical History:  Diagnosis Date  . Diabetes mellitus without complication ([place])   . GERD (gastroesophageal reflux disease)   . Hyperlipidemia   . Hypertension     Past Surgical History: History reviewed. No pertinent surgical history.  Family History: History reviewed. No pertinent family history.  Social History:  reports that he has never smoked. He has never used smokeless tobacco. He reports that he drinks alcohol. He reports that he does not use drugs.  Allergies: No Known Allergies  Medications: Medications reviewed in epic  Please HPI for pertinent positives, otherwise complete 10 system ROS negative.  Mallampati Score: MD Evaluation Airway: WNL Heart: Other (comments) Heart  comments: sinus arrhythmia Abdomen: WNL Chest/ Lungs: Other (comments) Chest/ lungs comments: [place] in place on 3L ASA  Classification: 3 Mallampati/Airway Score: Two  Physical Exam: BP (!) 152/74 (BP Location: Left Arm)   Pulse 92   Resp (!) 22   SpO2 100%  There is no height or weight on file to calculate BMI. General: pleasant, elderly black male who is laying in bed in NAD HEENT: head is normocephalic, atraumatic.  Sclera are noninjected.  PERRL.  Ears and nose without any masses or lesions.  Mouth is pink and moist Heart: sinus arrhythmia/irregular.  Normal s1,s2. No obvious murmurs, gallops, or rubs noted.  Palpable radial pulses bilaterally Lungs: CTAB, no wheezes, rhonchi, or  rales noted, decreased BS on right.  Respiratory effort nonlabored on 3L [place] Abd: soft, NT, ND, +BS, no masses, hernias, or organomegaly Psych: A&Ox3 with an appropriate affect.   Labs: Results for orders placed or performed during the hospital encounter of 01/13/17 (from the past 48 hour(s))  Glucose, capillary     Status: Abnormal   Collection Time: 01/20/17  4:24 PM  Result Value Ref Range   Glucose-Capillary 156 (H) 65 - 99 mg/dL  Glucose, capillary     Status: Abnormal   Collection Time: 01/20/17  7:57 PM  Result Value Ref Range   Glucose-Capillary 152 (H) 65 - 99 mg/dL   Comment 1 Notify RN    Comment 2 Document in Chart   Glucose, capillary     Status: Abnormal   Collection Time: 01/21/17  7:40 AM  Result Value Ref Range   Glucose-Capillary 117 (H) 65 - 99 mg/dL   Comment 1 Notify RN    Comment 2 Document in Chart   Glucose, capillary     Status: Abnormal   Collection Time: 01/21/17 11:22 AM  Result Value Ref Range   Glucose-Capillary 190 (H) 65 - 99 mg/dL   Comment 1 Notify RN    Comment 2 Document in Chart   Glucose, capillary     Status: Abnormal   Collection Time: 01/21/17  4:23 PM  Result Value Ref Range   Glucose-Capillary 119 (H) 65 - 99 mg/dL   Comment 1 Notify RN    Comment 2 Document in Chart   Glucose, capillary  Status: Abnormal   Collection Time: 01/21/17 10:17 PM  Result Value Ref Range   Glucose-Capillary 142 (H) 65 - 99 mg/dL  Protime-INR     Status: None   Collection Time: 01/22/17  4:04 AM  Result Value Ref Range   Prothrombin Time 13.8 11.4 - 15.2 seconds   INR 1.07   Glucose, capillary     Status: Abnormal   Collection Time: 01/22/17  7:42 AM  Result Value Ref Range   Glucose-Capillary 129 (H) 65 - 99 mg/dL   Comment 1 Notify RN    Comment 2 Document in Chart     Imaging: No results found.  Assessment/Plan 1. Recurrent malignant right pleural effusion  We will plan to proceed with pleurX catheter placement today on the right side  to help alleviate him symptoms going forward.  His labs and vitals have been reviewed.  Risks and benefits discussed with the patient including bleeding, infection. All of the patient's questions were answered, patient is agreeable to proceed. Consent signed and in chart.  Thank you for this interesting consult.  I greatly enjoyed meeting Robert Gates and look forward to participating in their care.  A copy of this report was sent to the requesting provider on this date.  Electronically Signed: Henreitta Cea 01/22/2017, 12:07 PM   I spent a total of 40 Minutes    in face to face in clinical consultation, greater than 50% of which was counseling/coordinating care for recurrent malignant right pleural effusion

## 2017-01-22 NOTE — Sedation Documentation (Signed)
Pt transferred back to [facility] by [place]. Awake and alert. In no distress.

## 2017-01-22 NOTE — Clinical Social Work Placement (Signed)
   CLINICAL SOCIAL WORK PLACEMENT  NOTE  Date:  01/22/2017  Patient Details  Name: Robert Gates MRN: [id number] Date of Birth: 1922/05/22  Clinical Social Work is seeking post-discharge placement for this patient at the [place] level of care (*CSW will initial, date and re-position this form in  chart as items are completed):  Yes   Patient/family provided with [place] Work Department's list of facilities offering this level of care within the geographic area requested by the patient (or if unable, by the patient's family).  Yes   Patient/family informed of their freedom to choose among providers that offer the needed level of care, that participate in Medicare, Medicaid or managed care program needed by the patient, have an available bed and are willing to accept the patient.  Yes   Patient/family informed of [facility]'s ownership interest in [facility] and [facility], as well as of the fact that they are under no obligation to receive care at these facilities.  PASRR submitted to EDS on 01/22/17     PASRR number received on 01/22/17     Existing PASRR number confirmed on       FL2 transmitted to all facilities in geographic area requested by pt/family on 01/22/17     FL2 transmitted to all facilities within larger geographic area on       Patient informed that his/her managed care company has contracts with or will negotiate with certain facilities, including the following:            Patient/family informed of bed offers received.  Patient chooses bed at       Physician recommends and patient chooses bed at      Patient to be transferred to   on  .  Patient to be transferred to facility by       Patient family notified on   of transfer.  Name of family member notified:        PHYSICIAN       Additional Comment:    _______________________________________________ Ihor Gully, LCSW 01/22/2017, 2:27 PM

## 2017-01-22 NOTE — Progress Notes (Signed)
Physical Therapy Treatment Patient Details Name: Robert Gates MRN: [id number] DOB: 07-04-1922 Today's Date: 01/22/2017    History of Present Illness 83 year old male who presented with dyspnea. Patient does have significant past medical history of type 2 diabetes mellitus, hypertension, GERD and dyslipidemia. Patient developed worsening dyspnea for last 2 weeks,progressive to the point where he became symptomatic with minimal efforts. On initial physical examination blood pressure 144/94, heart rate 95, respiratory 28-34, oxygen saturation 93% on supplemental oxygen. Moist mucous membranes, lungs with decreased breath sounds on the right, dull to percussion, no wheezing, rhonchi, heart S1-S2 present and rhythmic, no cause, rubs or murmurs, the abdomen was soft nontender, no nausea or edema. Sodium 140, potassium 4.4, chloride 103, bicarbonate 27, glucose 133, creatinine 1.18, white count 6.7, hemoglobin 10.3, hematocrit 32.5, platelets 264. Chest x-ray with a large right pleural effusion with complete opacification of the right hemithorax. CT with large right pleural effusion, likely right lower lobe mass and small right pulmonary nodules withlarge lymph nodes.     PT Comments    Pt seen for treatment.  Pt lives alone and continues to need min to mod assist with bed mobility and sit to stand.  He is improving with ambulation but did better with longer distance using a rolling walker and oxygen.  Pt will benefit from short term skilled therapy to improve his stability and functional endurance to decrease his risk of falling at home prior to home discharge.    Follow Up Recommendations   SNF     Equipment Recommendations    none      Precautions / Restrictions Precautions Precautions: None Restrictions Weight Bearing Restrictions: No    Mobility  Bed Mobility  min assist supine to sit                Transfers  min to mod assist sit to stand.                    Ambulation/Gait  With rolling walker and 02 able to ambulate with mod I  X 110 ft.                      Cognition    Alert and oriented                                          Exercises General Exercises - Lower Extremity Ankle Circles/Pumps: Both;5 reps Quad Sets: Both;5 reps Gluteal Sets: Both;5 reps Short Arc Quad: Both;5 reps Heel Slides: Both;5 reps Hip ABduction/ADduction: Both;5 reps Straight Leg Raises: Both;5 reps    General Comments        Pertinent Vitals/Pain      Home Living Family/patient expects to be discharged to:: Skilled nursing facility Living Arrangements: Alone Available Help at Discharge: Family Type of Home: House Home Access: Stairs to enter Entrance Stairs-Rails: None Home Layout: One level Home Equipment: Cane - single point;Bedside commode;Walker - 2 wheels      Prior Function Level of Independence: Independent      Comments: independent for all ADL's, driving throughout the community, community ambulator in the grocery store, ambulates with a single point cane, able to care for 15+ chickens in his backyard   PT Goals (current goals can now be found in the care plan section)      Frequency  PT Plan            End of Session  Pt tolerated well left in chair with call light             Time:  -     Charges:   There ex; Kalman Jewels, PT CLT [phone] 01/22/2017, 9:51 AM

## 2017-01-22 NOTE — Progress Notes (Signed)
Pt brought to [facility] by [facility]. Awake and alert. In no distress.

## 2017-01-22 NOTE — Progress Notes (Signed)
PROGRESS NOTE    Robert Gates  RAQ:[id number] DOB: 1922-12-30 DOA: 01/13/2017 PCP: System, Pcp Not In    Brief Narrative:  83 year old male who presented with dyspnea. Patient does have significant past medical history of type 2 diabetes mellitus, hypertension, GERD and dyslipidemia. Patient developed worsening dyspnea for last 2 weeks,progressive to the point where he became symptomatic with minimal efforts. On initial physical examination blood pressure 144/94, heart rate 95, respiratory 28-34, oxygen saturation 93% on supplemental oxygen. Moist mucous membranes, lungs with decreased breath sounds on the right, dull to percussion, no wheezing, rhonchi, heart S1-S2 present and rhythmic, no cause, rubs or murmurs, the abdomen was soft nontender, no nausea or edema. Sodium 140, potassium 4.4, chloride 103, bicarbonate 27, glucose 133, creatinine 1.18, white count 6.7, hemoglobin 10.3, hematocrit 32.5, platelets 264. Chest x-ray with a large right pleural effusion with complete opacification of the right hemithorax. CT with large right pleural effusion, likely right lower lobe mass and small right pulmonary nodules withlarge lymph nodes.   Patient was admitted to the hospital with the working diagnosis acute hypoxic respiratory failure due to large right pleural effusion   Assessment & Plan:   Principal Problem:   Pleural effusion on right Active Problems:   Acute respiratory failure (HCC)   HTN (hypertension)   DM II (diabetes mellitus, type II), controlled ([name])   Status post thoracentesis   1. Acute hypoxic failure due to large right exudative pleural effusion, suspected malignant effusion. For pleural catheter placement by IR today. On oxymetry monitoring and supplemental 02 per [name] to target 02 saturation, oxygen saturation   2. Right upper lobe pulmonary nodules. Multiple nodules in the right upper lobe, with the greatestmeasuringapproximately 1.7 x 1.8 cm.Will follow as  outpatient. Robert Gates and his family have decided to follow a conservative approach with no invasive interventions. Patient with significant deconditioning, will need SNF at discharge.   2. Hypertension. Continue amlodipine and lisinopril, systolic blood pressure 456 to 150 mmHg.  3. Type 2 diabetes mellitus. Capillary glucose 142, 129, 141. On sliding scale for glucose cover and monitoring.  4. CKD stage 2. Continue to avoid nephrotoxic medications or hypotension.   5. Urinary tract infection. Cephalexin, #4/5.    DVT prophylaxis:enoxaparin  Code Status:full Family Communication:No family at the bedside Disposition Plan:Patient very deconditioned and weak, will have physical therapy re-evaluation, may need SNF.    Consultants:  Pulmonary  Procedures:  Thoracentesis x2   Subjective: Patient complains of productive cough, persistent, but no dyspnea or chest pain, has been on 3 lpm oxygen per , no chest pain, no nausea or vomiting.   Objective: Vitals:   01/21/17 1300 01/21/17 1611 01/21/17 2213 01/22/17 0530  BP: (!) 109/46  (!) 130/59 140/60  Pulse: 83  100 95  Resp: 16  18 18   Temp: 98.3 F (36.8 C)  98.5 F (36.9 C) 98 F (36.7 C)  TempSrc: Oral  Oral Oral  SpO2: 100% 98% 96% 99%  Weight:      Height:        Intake/Output Summary (Last 24 hours) at 01/22/17 0909 Last data filed at 01/22/17 0600  Gross per 24 hour  Intake              120 ml  Output             1050 ml  Net             -930 ml   Filed Weights   01/13/17 0904  01/13/17 1627 01/18/17 0601  Weight: 88.5 kg (195 lb) 88.5 kg (195 lb) 88.5 kg (195 lb 1.7 oz)    Examination:   General: deconditioned Neurology: Awake and alert, non focal  E ENT: positive pallor, no icterus, oral mucosa moist Cardiovascular: No JVD. S1-S2 present, rhythmic, no gallops, rubs, or murmurs. Trace lower extremity edema. Pulmonary: decreased breath sounds bilaterally at bases more right than left,  decreased air movement, no wheezing, rhonchi or rales. Gastrointestinal. Abdomen protuberant, no organomegaly, non tender, no rebound or guarding Skin. No rashes Musculoskeletal: no joint deformities     Data Reviewed: I have personally reviewed following labs and imaging studies  CBC:  Recent Labs Lab 01/17/17 0407  WBC 5.1  NEUTROABS 2.8  HGB 10.3*  HCT 32.6*  MCV 76.5*  PLT 103   Basic Metabolic Panel:  Recent Labs Lab 01/17/17 0407 01/19/17 0600  NA 133* 133*  K 3.3* 3.2*  CL 98* 99*  CO2 28 27  GLUCOSE 111* 122*  BUN 21* 22*  CREATININE 1.37* 1.24  CALCIUM 8.1* 8.1*  MG 1.4*  --   PHOS 3.9  --    GFR: Estimated Creatinine Clearance: 43.1 mL/min (by C-G formula based on SCr of 1.24 mg/dL). Liver Function Tests: No results for input(s): AST, ALT, ALKPHOS, BILITOT, PROT, ALBUMIN in the last 168 hours. No results for input(s): LIPASE, AMYLASE in the last 168 hours. No results for input(s): AMMONIA in the last 168 hours. Coagulation Profile:  Recent Labs Lab 01/22/17 0404  INR 1.07   Cardiac Enzymes: No results for input(s): CKTOTAL, CKMB, CKMBINDEX, TROPONINI in the last 168 hours. BNP (last 3 results) No results for input(s): PROBNP in the last 8760 hours. HbA1C: No results for input(s): HGBA1C in the last 72 hours. CBG:  Recent Labs Lab 01/21/17 0740 01/21/17 1122 01/21/17 1623 01/21/17 2217 01/22/17 0742  GLUCAP 117* 190* 119* 142* 129*   Lipid Profile: No results for input(s): CHOL, HDL, LDLCALC, TRIG, CHOLHDL, LDLDIRECT in the last 72 hours. Thyroid Function Tests: No results for input(s): TSH, T4TOTAL, FREET4, T3FREE, THYROIDAB in the last 72 hours. Anemia Panel: No results for input(s): VITAMINB12, FOLATE, FERRITIN, TIBC, IRON, RETICCTPCT in the last 72 hours.    Radiology Studies: I have reviewed all of the imaging during this hospital visit personally     Scheduled Meds: . amLODipine  2.5 mg Oral Daily  . cephALEXin  500  mg Oral Q8H  . insulin aspart  0-9 Units Subcutaneous TID WC  . lisinopril  5 mg Oral Daily  . mouth rinse  15 mL Mouth Rinse BID   Continuous Infusions:   LOS: 8 days        Robert Gates [name], MD [facility] Pager [phone]

## 2017-01-22 NOTE — NC FL2 (Signed)
[place] LEVEL OF CARE SCREENING TOOL     IDENTIFICATION  Patient Name: Robert Gates Birthdate: July 21, 1922 Sex: male Admission Date (Current Location): 01/13/2017  [facility] and [place] Number:  [employer] and Address:  [place] [address] [place]      Provider Number: [id number]  Attending Physician Name and Address:  Tawni Millers  Relative Name and Phone Number:       Current Level of Care: Hospital Recommended Level of Care: [place] Prior Approval Number:    Date Approved/Denied:   PASRR Number: [id number]  Discharge Plan: Home    Current Diagnoses: Patient Active Problem List   Diagnosis Date Noted  . Status post thoracentesis   . Acute respiratory failure ([place]) 01/13/2017  . HTN (hypertension) 01/13/2017  . DM II (diabetes mellitus, type II), controlled ([place]) 01/13/2017  . Pleural effusion on right 01/13/2017    Orientation RESPIRATION BLADDER Height & Weight     Self, Time, Situation, Place  O2 (3L) Continent Weight: 195 lb 1.7 oz (88.5 kg) Height:  5' 9.5" (176.5 cm)  BEHAVIORAL SYMPTOMS/MOOD NEUROLOGICAL BOWEL NUTRITION STATUS      Continent Diet (See discharge summary)  AMBULATORY STATUS COMMUNICATION OF NEEDS Skin   Limited Assist Verbally Surgical wounds (from Pleurex catheter/drain placement)                       Personal Care Assistance Level of Assistance  Bathing, Feeding, Dressing Bathing Assistance: Limited assistance Feeding assistance: Independent Dressing Assistance: Limited assistance     Functional Limitations Info  Sight, Hearing, Speech Sight Info: Adequate Hearing Info: Adequate Speech Info: Adequate    SPECIAL CARE FACTORS FREQUENCY  PT (By licensed PT)     PT Frequency: 5x/week              Contractures Contractures Info: Not present    Additional Factors Info  Code Status Code Status Info: DNR              Current Medications (01/22/2017):  This is the current hospital active medication list Current Facility-Administered Medications  Medication Dose Route Frequency Provider Last Rate Last Dose  . acetaminophen (TYLENOL) tablet 650 mg  650 mg Oral Q6H PRN Eber Jones, MD       Or  . acetaminophen (TYLENOL) suppository 650 mg  650 mg Rectal Q6H PRN Eber Jones, MD      . albuterol (PROVENTIL) (2.5 MG/3ML) 0.083% nebulizer solution 2.5 mg  2.5 mg Nebulization Q6H PRN Eber Jones, MD   2.5 mg at 01/14/17 0305  . amLODipine (NORVASC) tablet 2.5 mg  2.5 mg Oral Daily Eber Jones, MD   2.5 mg at 01/22/17 0935  . ceFAZolin (ANCEF) IVPB 2g/100 mL premix  2 g Intravenous Q8H Saverio Danker, PA-C      . guaiFENesin (ROBITUSSIN) 100 MG/5ML solution 100 mg  5 mL Oral Q4H PRN Grazia Taffe, Jimmy Picket, MD      . insulin aspart (novoLOG) injection 0-9 Units  0-9 Units Subcutaneous TID WC Eber Jones, MD   1 Units at 01/22/17 0801  . lisinopril (PRINIVIL,ZESTRIL) tablet 5 mg  5 mg Oral Daily Eber Jones, MD   5 mg at 01/22/17 0935  . MEDLINE mouth rinse  15 mL Mouth Rinse BID Eber Jones, MD   15 mL at 01/22/17 1000  . ondansetron (ZOFRAN) tablet 4 mg  4 mg Oral  Q6H PRN Eber Jones, MD       Or  . ondansetron [facility]) injection 4 mg  4 mg Intravenous Q6H PRN Eber Jones, MD      . polyethylene glycol (MIRALAX / GLYCOLAX) packet 17 g  17 g Oral Daily PRN Eber Jones, MD       Facility-Administered Medications Ordered in Other Encounters  Medication Dose Route Frequency Provider Last Rate Last Dose  . ceFAZolin (ANCEF) IVPB 2g/100 mL premix  2 g Intravenous Once Saverio Danker, PA-C      . fentaNYL (SUBLIMAZE) 100 MCG/2ML injection           . midazolam (VERSED) 2 MG/2ML injection              Discharge Medications: Please see discharge summary for a list of discharge medications.  Relevant Imaging  Results:  Relevant Lab Results:   Additional Information SSN [id number]. Patient has pleurex catheter/drain placed.   Settle, Clydene Pugh, LCSW

## 2017-01-22 NOTE — Care Management Important Message (Signed)
Important Message  Patient Details  Name: Robert Gates MRN: [id number] Date of Birth: 1923/03/07   Medicare Important Message Given:  Yes    Sherald Barge, RN 01/22/2017, 1:31 PM

## 2017-01-22 NOTE — Care Management Note (Signed)
Case Management Note  Patient Details  Name: Robert Gates MRN: [id number] Date of Birth: 24-Apr-1922  Expected Discharge Date:      01/22/2017            Expected Discharge Plan:  [place]  In-House Referral:  Clinical Social Work  Discharge planning Services  CM Consult  Post Acute Care Choice:  NA Choice offered to:  NA  Status of Service:  Completed, signed off  If discussed at [place] of Stay Meetings, dates discussed:  01/22/2017  Additional Comments: Anticipate transition to SNF today after pleurex catheter is placed. Pt has 10 bottles on floor to be sent with pt. Facility should order their own bottles, order form will be sent with patient in the event they need to order them. CSW making arrangements for placement.   Sherald Barge, RN 01/22/2017, 1:32 PM

## 2017-01-23 LAB — GLUCOSE, CAPILLARY
GLUCOSE-CAPILLARY: 133 mg/dL — AB (ref 65–99)
GLUCOSE-CAPILLARY: 195 mg/dL — AB (ref 65–99)
GLUCOSE-CAPILLARY: 145 mg/dL — AB (ref 65–99)

## 2017-01-23 MED ORDER — LISINOPRIL 5 MG PO TABS: 5.0000 mg | ORAL_TABLET | Freq: Every day | ORAL | Status: AC

## 2017-01-23 MED ORDER — GUAIFENESIN 100 MG/5ML PO SOLN: 5.0000 mL | ORAL | 0 refills | Status: AC | PRN

## 2017-01-23 MED ORDER — POLYETHYLENE GLYCOL 3350 17 G PO PACK: 17.0000 g | PACK | Freq: Every day | ORAL | 0 refills | Status: AC | PRN

## 2017-01-23 MED ORDER — ALBUTEROL SULFATE (2.5 MG/3ML) 0.083% IN NEBU: 2.5000 mg | INHALATION_SOLUTION | Freq: Four times a day (QID) | RESPIRATORY_TRACT | 12 refills | Status: AC | PRN

## 2017-01-23 MED ORDER — AMLODIPINE BESYLATE 2.5 MG PO TABS: 2.5000 mg | ORAL_TABLET | Freq: Every day | ORAL | Status: AC

## 2017-01-23 NOTE — Progress Notes (Signed)
Physical Therapy Treatment Patient Details Name: Kerron Sedano MRN: [id number] DOB: [birth year] Today's Date: 01/23/2017    History of Present Illness 83 year old male who presented with dyspnea. Patient does have significant past medical history of type 2 diabetes mellitus, hypertension, GERD and dyslipidemia. Patient developed worsening dyspnea for last 2 weeks,progressive to the point where he became symptomatic with minimal efforts. On initial physical examination blood pressure 144/94, heart rate 95, respiratory 28-34, oxygen saturation 93% on supplemental oxygen. Moist mucous membranes, lungs with decreased breath sounds on the right, dull to percussion, no wheezing, rhonchi, heart S1-S2 present and rhythmic, no cause, rubs or murmurs, the abdomen was soft nontender, no nausea or edema. Sodium 140, potassium 4.4, chloride 103, bicarbonate 27, glucose 133, creatinine 1.18, white count 6.7, hemoglobin 10.3, hematocrit 32.5, platelets 264. Chest x-ray with a large right pleural effusion with complete opacification of the right hemithorax. CT with large right pleural effusion, likely right lower lobe mass and small right pulmonary nodules withlarge lymph nodes.     PT Comments    Patient received in bedside recliner for therapy today, patient agreeable to participate. Mr. Marcucci is progressing in therapy but remains below his baseline of independent with a single point cane and currently requires minimal assistance for bed mobility, and supervision/min guarding for transfers, and gait with front wheeled walker. Pt lives alone and will benefit from short term skilled PT in below stated setting to improve functional independence and address the following impairments: decreased activity tolerance, impaired balance, decreased cardiopulmonary endurance, and decreased strength. Acute PT will follow throughout stay to address current functional mobility.   Follow Up Recommendations  SNF     Equipment  Recommendations       Recommendations for Other Services       Precautions / Restrictions Precautions Precautions: Fall Restrictions Weight Bearing Restrictions: No    Mobility   Transfers Overall transfer level: Needs assistance Equipment used: Rolling walker (2 wheeled) Transfers: Sit to/from Stand;Stand Pivot Transfers Sit to Stand: Min guard;Min assist Stand pivot transfers: Min guard;Min assist  General transfer comment: tactile cues and assistance to initiate forward trunk lean and safe sequencing for sit<>stand transfer  Ambulation/Gait Ambulation/Gait assistance: Supervision;Min guard Ambulation Distance (Feet): 100 Feet Assistive device: Rolling walker (2 wheeled) Gait Pattern/deviations: Decreased stride length;Wide base of support   Gait velocity interpretation: <1.8 ft/sec, indicative of risk for recurrent falls General Gait Details: patient with wide base of support and decreased step length bilaterally, with overall slow gait velocity, no overt LOB throughout gait       Balance Overall balance assessment: Needs assistance Sitting-balance support: Feet supported;No upper extremity supported Sitting balance-Leahy Scale: Good     Standing balance support: Bilateral upper extremity supported Standing balance-Leahy Scale: Fair Standing balance comment: patient requires min guard during static/dynamic standing activities due to unsteadiness     Cognition Arousal/Alertness: Awake/alert Behavior During Therapy: WFL for tasks assessed/performed Overall Cognitive Status: Within Functional Limits for tasks assessed  General Comments: oriented x4, pleasant demeanor      Exercises General Exercises - Lower Extremity Ankle Circles/Pumps: AROM;10 reps;Seated;Both Gluteal Sets: AROM;10 reps;Seated;Both Long Arc Quad: AROM;Both;10 reps;Seated    General Comments        Pertinent Vitals/Pain Pain Assessment: No/denies pain           PT Goals (current  goals can now be found in the care plan section) Acute Rehab PT Goals Patient Stated Goal: return home to take care of chickens PT Goal Formulation: With patient  Time For Goal Achievement: 01/25/17 Potential to Achieve Goals: Fair Progress towards PT goals: Progressing toward goals    Frequency    Min 3X/week      PT Plan Current plan remains appropriate       AM-PAC PT "6 Clicks" Daily Activity  Outcome Measure  Difficulty turning over in bed (including adjusting bedclothes, sheets and blankets)?: Unable Difficulty moving from lying on back to sitting on the side of the bed? : Unable Difficulty sitting down on and standing up from a chair with arms (e.g., wheelchair, bedside commode, etc,.)?: Unable Help needed moving to and from a bed to chair (including a wheelchair)?: A Little Help needed walking in hospital room?: A Little Help needed climbing 3-5 steps with a railing? : A Lot 6 Click Score: 11    End of Session Equipment Utilized During Treatment: Gait belt;Oxygen (front wheeled walker) Activity Tolerance: Patient tolerated treatment well Patient left: in chair;with call bell/phone within reach Nurse Communication: Mobility status PT Visit Diagnosis: Other abnormalities of gait and mobility (R26.89);Unsteadiness on feet (R26.81);Other (comment);Muscle weakness (generalized) (M62.81);Difficulty in walking, not elsewhere classified (R26.2)     Time: 4944-9675 PT Time Calculation (min) (ACUTE ONLY): 37 min  Charges:  $Therapeutic Activity: 23-37 mins                    G Codes:       Debara Pickett, PT, DPT Physical Therapist with [facility]  01/23/2017 1:00 PM

## 2017-01-23 NOTE — Progress Notes (Signed)
Report called to [place], Therapist, sports at the [facility], [place].

## 2017-01-23 NOTE — Evaluation (Signed)
Occupational Therapy Evaluation Patient Details Name: Robert Gates MRN: [id number] DOB: 1923/02/28 Today's Date: 01/23/2017    History of Present Illness 83 year old male who presented with dyspnea. Patient does have significant past medical history of type 2 diabetes mellitus, hypertension, GERD and dyslipidemia. Patient developed worsening dyspnea for last 2 weeks,progressive to the point where he became symptomatic with minimal efforts. On initial physical examination blood pressure 144/94, heart rate 95, respiratory 28-34, oxygen saturation 93% on supplemental oxygen. Moist mucous membranes, lungs with decreased breath sounds on the right, dull to percussion, no wheezing, rhonchi, heart S1-S2 present and rhythmic, no cause, rubs or murmurs, the abdomen was soft nontender, no nausea or edema. Sodium 140, potassium 4.4, chloride 103, bicarbonate 27, glucose 133, creatinine 1.18, white count 6.7, hemoglobin 10.3, hematocrit 32.5, platelets 264. Chest x-ray with a large right pleural effusion with complete opacification of the right hemithorax. CT with large right pleural effusion, likely right lower lobe mass and small right pulmonary nodules withlarge lymph nodes.    Clinical Impression   Pt received supine in bed, agreeable to OT evaluation. Pt requiring increased time for ADL completion, assistance/supervision for sitting and standing tasks due to weakness and fatigue. PTA pt was independent in B/IADLs. Recommend SNF on discharge to improve strength, safety and independence in ADL completion and functional mobility.     Follow Up Recommendations  SNF    Equipment Recommendations  None recommended by OT       Precautions / Restrictions Precautions Precautions: Fall Restrictions Weight Bearing Restrictions: No      Mobility Bed Mobility Overal bed mobility: Needs Assistance Bed Mobility: Supine to Sit     Supine to sit: Min guard;HOB elevated     General bed mobility comments:  Increased time and verbal cuing to use rail to pull to sit  Transfers Overall transfer level: Needs assistance Equipment used: Rolling walker (2 wheeled) Transfers: Sit to/from [facility] Sit to Stand: Min guard;From elevated surface Stand pivot transfers: Min guard                ADL either performed or assessed with clinical judgement   ADL Overall ADL's : Needs assistance/impaired Eating/Feeding: Modified independent;Sitting   Grooming: Min guard;Standing   Upper Body Bathing: Min guard;Sitting   Lower Body Bathing: Moderate assistance;Sitting/lateral leans   Upper Body Dressing : Minimal assistance;Sitting   Lower Body Dressing: Moderate assistance;Sitting/lateral leans                 General ADL Comments: Pt requiring increased time and assistance for ADL completion due to weakness      Vision Baseline Vision/History: No visual deficits Patient Visual Report: No change from baseline Vision Assessment?: No apparent visual deficits            Pertinent Vitals/Pain Pain Assessment: No/denies pain     Hand Dominance Right   Extremity/Trunk Assessment Upper Extremity Assessment Upper Extremity Assessment: Generalized weakness   Lower Extremity Assessment Lower Extremity Assessment: Defer to PT evaluation   Cervical / Trunk Assessment Cervical / Trunk Assessment: Normal   Communication Communication Communication: No difficulties   Cognition Arousal/Alertness: Awake/alert Behavior During Therapy: WFL for tasks assessed/performed Overall Cognitive Status: Within Functional Limits for tasks assessed  Home Living Family/patient expects to be discharged to:: Skilled nursing facility Living Arrangements: Alone Available Help at Discharge: Family Type of Home: House Home Access: Stairs to enter [employer] of Steps: 2 in the front, 5 in the back, no  railings Entrance Stairs-Rails: None Home Layout: One level         Biochemist, clinical: Standard     Home Equipment: Cane - single point;Bedside commode;Walker - 2 wheels          Prior Functioning/Environment Level of Independence: Independent        Comments: independent for all ADL's, driving throughout the community, community ambulator in the grocery store, ambulates with a single point cane, able to care for 15+ chickens in his backyard        OT Problem List: Decreased strength;Decreased activity tolerance;Decreased safety awareness;Decreased knowledge of use of DME or AE       End of Session Equipment Utilized During Treatment: Gait belt;Rolling walker  Activity Tolerance: Patient limited by fatigue Patient left: in chair;with call bell/phone within reach;with chair alarm set  OT Visit Diagnosis: Muscle weakness (generalized) (M62.81)                Time: 9357-0177 OT Time Calculation (min): 24 min Charges:  OT General Charges $OT Visit: 1 Visit OT Evaluation $OT Eval Low Complexity: [place], OTR/L  [phone] 01/23/2017, 8:11 AM

## 2017-01-23 NOTE — Clinical Social Work Placement (Signed)
   CLINICAL SOCIAL WORK PLACEMENT  NOTE  Date:  01/23/2017  Patient Details  Name: Robert Gates MRN: [id number] Date of Birth: 06/05/1922  Clinical Social Work is seeking post-discharge placement for this patient at the [place] level of care (*CSW will initial, date and re-position this form in  chart as items are completed):  Yes   Patient/family provided with [place] Work Department's list of facilities offering this level of care within the geographic area requested by the patient (or if unable, by the patient's family).  Yes   Patient/family informed of their freedom to choose among providers that offer the needed level of care, that participate in Medicare, Medicaid or managed care program needed by the patient, have an available bed and are willing to accept the patient.  Yes   Patient/family informed of [facility]'s ownership interest in [facility] and [facility], as well as of the fact that they are under no obligation to receive care at these facilities.  PASRR submitted to EDS on 01/22/17     PASRR number received on 01/22/17     Existing PASRR number confirmed on       FL2 transmitted to all facilities in geographic area requested by pt/family on 01/22/17     FL2 transmitted to all facilities within larger geographic area on       Patient informed that his/her managed care company has contracts with or will negotiate with certain facilities, including the following:        Yes   Patient/family informed of bed offers received.  Patient chooses bed at [facility]     Physician recommends and patient chooses bed at      Patient to be transferred to [facility] on 01/23/17.  Patient to be transferred to facility by [facility]     Patient family notified on 01/23/17 of transfer.  Name of family member notified:  Nate Bi     PHYSICIAN       Additional Comment:  Facility notified and discharge  clinicals sent. LCSW signing off.   _______________________________________________ Ihor Gully, LCSW 01/23/2017, 3:23 PM

## 2017-01-23 NOTE — Discharge Summary (Signed)
Physician Discharge Summary  Robert Gates BJS:[id number] DOB: [birth year] DOA: 01/13/2017  PCP: System, Pcp Not In  Admit date: 01/13/2017 Discharge date: 01/23/2017  Admitted From: Home Disposition: Skilled nursing facility  Recommendations for Outpatient Follow-up:  1. Patient will be discharged to skilled nursing facility 2. Pleurx catheter will need to be drained daily 3. Follow-up with Dr. Luan Pulling, pulmonology in 1 month  Home Health: Equipment/Devices: Right-sided Pleurx catheter  Discharge Condition: Stable CODE STATUS: DNR Diet recommendation: Heart Healthy / Carb Modified   Brief/Interim Summary: 83 year old male with a history of diabetes, GERD, hypertension, presents to the hospital with complaints of shortness of breath.  Found to have right-sided pleural effusion underwent 2 thoracentesis.  Further imaging indicated pulmonary nodules.  There is concerned the pleural fluid may be related to underlying malignancy, although patient and family did not wish further invasive workup at this time.  Despite undergoing thoracentesis twice during this admission, patient did have reaccumulation of fluid.  After being seen by pulmonology, it was recommended the patient had Pleurx catheter placed for continued drainage.  This was done by interventional radiology on 60/73 without complication.  His catheter will need to be drained on a daily basis.  He will need to follow-up with pulmonology in the next month.  The remainder of his medical problems remained stable.  He will plan to be discharged to a skilled nursing facility today.  Discharge Diagnoses:  Principal Problem:   Pleural effusion on right Active Problems:   Acute respiratory failure (HCC)   HTN (hypertension)   DM II (diabetes mellitus, type II), controlled ([place])   Status post thoracentesis    Discharge Instructions  Discharge Instructions    Diet - low sodium heart healthy    Complete by:  As directed    Increase  activity slowly    Complete by:  As directed      Allergies as of 01/23/2017   No Known Allergies     Medication List    STOP taking these medications   cephALEXin 500 MG capsule Commonly known as:  KEFLEX     TAKE these medications   albuterol (2.5 MG/3ML) 0.083% nebulizer solution Commonly known as:  PROVENTIL Take 3 mLs (2.5 mg total) by nebulization every 6 (six) hours as needed for wheezing or shortness of breath.   amLODipine 2.5 MG tablet Commonly known as:  NORVASC Take 1 tablet (2.5 mg total) by mouth daily. What changed:  medication strength  how much to take  Another medication with the same name was removed. Continue taking this medication, and follow the directions you see here.   guaiFENesin 100 MG/5ML Soln Commonly known as:  ROBITUSSIN Take 5 mLs (100 mg total) by mouth every 4 (four) hours as needed for cough or to loosen phlegm.   lisinopril 5 MG tablet Commonly known as:  PRINIVIL,ZESTRIL Take 1 tablet (5 mg total) by mouth daily. What changed:  medication strength  how much to take  Another medication with the same name was removed. Continue taking this medication, and follow the directions you see here.   metFORMIN 1000 MG tablet Commonly known as:  GLUCOPHAGE Take 1,000 mg by mouth 2 (two) times daily with a meal.   polyethylene glycol packet Commonly known as:  MIRALAX / GLYCOLAX Take 17 g by mouth daily as needed for mild constipation.   pravastatin 40 MG tablet Commonly known as:  PRAVACHOL Take 40 mg by mouth daily.   Vitamin D (Ergocalciferol) 50000 units Caps capsule  Commonly known as:  DRISDOL Take 50,000 Units by mouth every 7 (seven) days. wednesday       No Known Allergies  Consultations:  Pulmonology  Interventional radiology   Procedures/Studies: Dg Chest 1 View  Result Date: 01/16/2017 CLINICAL DATA:  Status post right thoracentesis today. EXAM: CHEST 1 VIEW COMPARISON:  CT chest 01/15/2017. Single-view  of the chest 01/14/2017. FINDINGS: Right pleural effusion is decreased after thoracentesis. No pneumothorax. Right middle and lower lobe airspace disease identified. Left lung is clear. Heart size is mildly enlarged. IMPRESSION: Negative for pneumothorax after right thoracentesis. Small to moderate residual right pleural effusion with right middle and lower lobe airspace disease. Cardiomegaly without edema. Electronically Signed   By: Inge Rise M.D.   On: 01/16/2017 11:11   Dg Chest 1 View  Result Date: 01/14/2017 CLINICAL DATA:  RIGHT pleural effusion post thoracentesis EXAM: CHEST 1 VIEW COMPARISON:  01/13/2017 FINDINGS: Enlargement of cardiac silhouette. Stable mediastinal contours. Mater RIGHT pleural effusion with significant atelectasis of the mid to lower RIGHT lung though both improved since the previous exam, which demonstrated complete opacification of the RIGHT hemithorax. LEFT basilar atelectasis consistent with expiratory technique. No pneumothorax. IMPRESSION: No pneumothorax following RIGHT thoracentesis. Residual moderate RIGHT pleural effusion and basilar atelectasis. Electronically Signed   By: Lavonia Dana M.D.   On: 01/14/2017 14:14   Dg Chest 2 View  Result Date: 01/20/2017 CLINICAL DATA:  Pleural effusion. EXAM: CHEST  2 VIEW COMPARISON:  Chest x-rays dated 01/18/2017 and 01/16/2017. FINDINGS: Persistent right pleural effusion, small to moderate in size, not significantly changed. Left lung remains clear. Heart size and mediastinal contours appear stable. IMPRESSION: Right pleural effusion, small to moderate in size, not significantly changed compared to most recent chest x-ray of 01/18/2017. Electronically Signed   By: Franki Cabot M.D.   On: 01/20/2017 09:41   Dg Chest 2 View  Result Date: 01/18/2017 CLINICAL DATA:  Pleural effusion EXAM: CHEST  2 VIEW COMPARISON:  January 16, 2017 FINDINGS: There is a persistent right pleural effusion with patchy atelectatic change in  the right lower lobe. There is a minimal left pleural effusion with slight left base atelectatic change. Upper lung zones are clear. Heart is upper normal in size with pulmonary vascularity within normal limits. There is aortic atherosclerosis. No adenopathy. No evident bone lesions. IMPRESSION: Pleural effusions bilaterally, larger on the right than on the left. Moderate atelectatic change in the right lower lobe. Patchy infiltrate may well be present superimposed in the right base region. There is slight left base atelectasis. Cardiac silhouette is stable.  There is aortic atherosclerosis. Aortic Atherosclerosis (ICD10-I70.0). Electronically Signed   By: Lowella Grip III M.D.   On: 01/18/2017 10:00   Dg Chest 2 View  Result Date: 01/13/2017 CLINICAL DATA:  Acute shortness of breath. EXAM: CHEST  2 VIEW COMPARISON:  None. FINDINGS: Complete opacification of the right hemithorax likely represents a large pleural effusion. The left lung is clear. The visualized portions of the cardiomediastinal silhouette are unremarkable. No acute bony abnormalities identified. IMPRESSION: Complete opacification the right hemithorax likely representing a large right pleural effusion. Consider chest CT with contrast for further evaluation as clinically indicated. Electronically Signed   By: Margarette Canada M.D.   On: 01/13/2017 10:01   Ct Chest Wo Contrast  Result Date: 01/15/2017 CLINICAL DATA:  Two week history of shortness of breath which has worsened over the past 2-3 days. Follow-up right pleural effusion and possible right lung malignancy. Thoracentesis was performed yesterday.  EXAM: CT CHEST WITHOUT CONTRAST TECHNIQUE: Multidetector CT imaging of the chest was performed following the standard protocol without IV contrast. COMPARISON:  01/13/2017. FINDINGS: Cardiovascular: Heart mildly enlarged as noted previously. Mild LAD and left circumflex coronary atherosclerosis. Very small, likely physiologic pericardial  effusion, unchanged. Moderate to severe atherosclerosis involving the thoracic and upper abdominal aorta without evidence of aneurysm. Mediastinum/Nodes: Lymphadenopathy described on the CT 2 days ago and is less well demonstrated without IV contrast. Fluid and possible mass involving the upper thoracic esophagus. Small hiatal hernia. Lungs/Pleura: Interval decrease in the right pleural effusion after the thoracentesis yesterday, though a large right pleural effusion persists. There is dense consolidation in the right lower lobe and in the right middle lobe, and there is patchy airspace disease involving the right upper lobe. Multiple nodules are present in the right upper lobe, better seen on today's examination as there is improved aeration in the right upper lobe when compared to the CT 2 days ago. The largest nodule measures approximately 1.7 x 1.8 cm (series 4, image 82). Left lung clear without nodularity or consolidation. No left pleural effusion. Near complete occlusion of the right lower lobe bronchus (best seen on series 4, image 91). Upper Abdomen: Cholelithiasis with calcified gallstones in the gallbladder. No acute abnormalities involving the visualized upper abdomen. 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 Lucent lesions in L1 and L2 as identified on the prior CT. IMPRESSION: 1. Persistent large right pleural effusion, though the effusion has decreased in size after the thoracentesis yesterday. 2. Multiple right upper lobe lung nodules, the largest measuring approximately 1.8 cm. These nodules are better seen as the right upper lobe is better aerated on today's examination. 3. Fluid and/or mass involving the upper thoracic esophagus. Does the patient have dysphagia? 4. Near complete occlusion of the left lower lobe bronchus. 5. Lymphadenopathy visualized on the CT 2 days ago is not as well seen on today's unenhanced examination. 6. Cholelithiasis without evidence  of acute cholecystitis. 7. Lucent lesions in L1 and L2 as identified on the prior CT. As recommended on that report, lumbar spine MRI without and with contrast for nuclear medicine whole-body bone scan may be helpful in further evaluation. 8.  Aortic Atherosclerosis (ICD10-170.0) Electronically Signed   By: Evangeline Dakin M.D.   On: 01/15/2017 15:34   Ct Chest W Contrast  Result Date: 01/13/2017 CLINICAL DATA:  83 year old male with shortness of breath, abdominal and pelvic pain and unintentional weight loss. Questionable history of prostate cancer. EXAM: CT CHEST, ABDOMEN, AND PELVIS WITH CONTRAST TECHNIQUE: Multidetector CT imaging of the chest, abdomen and pelvis was performed following the standard protocol during bolus administration of intravenous contrast. CONTRAST:  19mL ISOVUE-300 IOPAMIDOL (ISOVUE-300) INJECTION 61% COMPARISON:  01/13/2017 chest radiograph today FINDINGS: CT CHEST FINDINGS Cardiovascular: Cardiomegaly and a very small pericardial effusion are noted. Thoracic aortic atherosclerotic calcifications noted without aneurysm. Mediastinum/Nodes: There are several enlarged lymph nodes in the lower right juxtapericardial/juxta diaphragmatic fat within the chest. No other definite enlarged lymph nodes are noted. Mediastinum is shifted to the left. Lungs/Pleura: A very large right pleural effusion noted with complete compressive atelectasis of the right lung. Hypodense areas within portions of the atelectatic right lung noted which probably represent nodules/mass. These include the following (series 2): A 1.2 cm area in the right upper lobe (image 29) A 1 cm area within the right middle lobe (image 34) A 2.5 x 4 cm area/possible mass within the right lower lobe (image 37). There  are small areas of probable right pleural enhancement, greatest inferiorly. The left lung is clear. Musculoskeletal: No acute or definite focal bony lesions noted. CT ABDOMEN PELVIS FINDINGS Hepatobiliary: The liver is  unremarkable. Cholelithiasis identified. No biliary dilatation. Pancreas: Unremarkable Spleen: Unremarkable Adrenals/Urinary Tract: The kidneys, adrenal glands and bladder are are unremarkable except for small left bladder diverticulum and mild bilateral renal cortical thinning. Stomach/Bowel: The stomach is not well distended. No definite bowel wall thickening or inflammatory changes noted. No dilated bowel loops or evidence of bowel obstruction noted. Colonic diverticulosis noted without evidence of diverticulitis. Vascular/Lymphatic: Aortic atherosclerosis. No enlarged abdominal or pelvic lymph nodes. Reproductive: Surgical changes within the prostate identified. Other: No ascites, focal collection or pneumoperitoneum. Musculoskeletal: Lucent lesions within the L1 and L2 vertebral bodies may represent hemangiomas but or nonspecific. No acute bony abnormalities are identified. IMPRESSION: 1. Large right pleural effusion with probable focal inferior pleural enhancement, probable right lower lobe mass and other smaller right pulmonary nodules and enlarged lymph nodes in the adjacent lower right pericardial fat. These findings are highly suspicious for malignancy/metastatic disease. 2. Lucent lesions within the bodies of L1 and L2 - nonspecific but may represent hemangiomas. Consider MRI or bone scan for further evaluation. 3. Cholelithiasis 4. Cardiomegaly 5.  Aortic Atherosclerosis (ICD10-I70.0). Electronically Signed   By: Margarette Canada M.D.   On: 01/13/2017 12:23   Ct Abdomen Pelvis W Contrast  Result Date: 01/13/2017 CLINICAL DATA:  83 year old male with shortness of breath, abdominal and pelvic pain and unintentional weight loss. Questionable history of prostate cancer. EXAM: CT CHEST, ABDOMEN, AND PELVIS WITH CONTRAST TECHNIQUE: Multidetector CT imaging of the chest, abdomen and pelvis was performed following the standard protocol during bolus administration of intravenous contrast. CONTRAST:  149mL  ISOVUE-300 IOPAMIDOL (ISOVUE-300) INJECTION 61% COMPARISON:  01/13/2017 chest radiograph today FINDINGS: CT CHEST FINDINGS Cardiovascular: Cardiomegaly and a very small pericardial effusion are noted. Thoracic aortic atherosclerotic calcifications noted without aneurysm. Mediastinum/Nodes: There are several enlarged lymph nodes in the lower right juxtapericardial/juxta diaphragmatic fat within the chest. No other definite enlarged lymph nodes are noted. Mediastinum is shifted to the left. Lungs/Pleura: A very large right pleural effusion noted with complete compressive atelectasis of the right lung. Hypodense areas within portions of the atelectatic right lung noted which probably represent nodules/mass. These include the following (series 2): A 1.2 cm area in the right upper lobe (image 29) A 1 cm area within the right middle lobe (image 34) A 2.5 x 4 cm area/possible mass within the right lower lobe (image 37). There are small areas of probable right pleural enhancement, greatest inferiorly. The left lung is clear. Musculoskeletal: No acute or definite focal bony lesions noted. CT ABDOMEN PELVIS FINDINGS Hepatobiliary: The liver is unremarkable. Cholelithiasis identified. No biliary dilatation. Pancreas: Unremarkable Spleen: Unremarkable Adrenals/Urinary Tract: The kidneys, adrenal glands and bladder are are unremarkable except for small left bladder diverticulum and mild bilateral renal cortical thinning. Stomach/Bowel: The stomach is not well distended. No definite bowel wall thickening or inflammatory changes noted. No dilated bowel loops or evidence of bowel obstruction noted. Colonic diverticulosis noted without evidence of diverticulitis. Vascular/Lymphatic: Aortic atherosclerosis. No enlarged abdominal or pelvic lymph nodes. Reproductive: Surgical changes within the prostate identified. Other: No ascites, focal collection or pneumoperitoneum. Musculoskeletal: Lucent lesions within the L1 and L2 vertebral  bodies may represent hemangiomas but or nonspecific. No acute bony abnormalities are identified. IMPRESSION: 1. Large right pleural effusion with probable focal inferior pleural enhancement, probable right lower lobe mass and  other smaller right pulmonary nodules and enlarged lymph nodes in the adjacent lower right pericardial fat. These findings are highly suspicious for malignancy/metastatic disease. 2. Lucent lesions within the bodies of L1 and L2 - nonspecific but may represent hemangiomas. Consider MRI or bone scan for further evaluation. 3. Cholelithiasis 4. Cardiomegaly 5.  Aortic Atherosclerosis (ICD10-I70.0). Electronically Signed   By: Margarette Canada M.D.   On: 01/13/2017 12:23   Ir Guided Niel Hummer W Catheter Placement  Result Date: 01/22/2017 INDICATION: Malignant right pleural effusion, shortness of breath EXAM: Ultrasound guidance for needle access Right chest tunneled pleural drain (PleurX catheter) MEDICATIONS: Ancef 2 g administered within 1 hour of the procedure ANESTHESIA/SEDATION: Fentanyl 50 mcg IV; Versed 1.5 mg IV Moderate Sedation Time:  20 minutes The patient was continuously monitored during the procedure by the interventional radiology nurse under my direct supervision. COMPLICATIONS: None immediate. PROCEDURE: Informed written consent was obtained from the patient after a thorough discussion of the procedural risks, benefits and alternatives. All questions were addressed. Maximal Sterile Barrier Technique was utilized including caps, mask, sterile gowns, sterile gloves, sterile drape, hand hygiene and skin antiseptic. A timeout was performed prior to the initiation of the procedure. Previous imaging reviewed. Patient positioned right anterior oblique. Preliminary ultrasound performed. The large non loculated right pleural effusion was localized. A lower intercostal space in the mid axillary line was marked. Under sterile conditions and local anesthesia, percutaneous needle access performed  with ultrasound. Needle position confirmed with ultrasound. Images obtained for documentation. Guidewire advanced under ultrasound and confirmed within the pleural space. In the adjacent chest soft tissues, a subcutaneous tunnel was created under sterile conditions and local anesthesia. The PleurX catheter was tunneled subcutaneously to the puncture site and advanced into the right pleural space through a valved peel-away sheath. Position confirmed with ultrasound and fluoroscopy. Images obtained for documentation. Following insertion, a 1 L right thoracentesis was performed with the catheter. Catheter was secured with a Prolene suture. Entry site closed with subcuticular Vicryl suture and derma bond. Patient tolerated the procedure well. No immediate complication. IMPRESSION: Successful ultrasound and fluoroscopic right chest tunneled pleural drain (PleurX catheter). 1 L thoracentesis performed with the catheter after insertion. Electronically Signed   By: Jerilynn Mages.  Shick M.D.   On: 01/22/2017 12:51   Dg Esophagus  Result Date: 01/17/2017 CLINICAL DATA:  Abnormal CT chest exam with wall thickening, fluid and question mass in the proximal thoracic esophagus ; patient denies swallowing difficulties. EXAM: ESOPHOGRAM/BARIUM SWALLOW TECHNIQUE: Single contrast examination was performed using thin barium. Patient also swallowed a 12.5 mm diameter barium tablet. FLUOROSCOPY TIME:  Fluoroscopy Time:  2 minutes 0 seconds Radiation Exposure Index (if provided by the fluoroscopic device): 47.6 mGy Number of Acquired Spot Images: multiple fluoroscopic screen captures COMPARISON:  CT chest 01/15/2017 FINDINGS: Grossly normal esophageal distention without mass or stricture. No persistent intraluminal filling defects. Specifically no abnormalities identified in the thoracic esophagus at the site of questioned abnormality on CT, presumed to about an artifact. Diffuse age-related esophageal dysmotility. Esophageal wall grossly  smooth without irregularity or ulceration. Targeted rapid sequence imaging of the cervical esophagus and hypopharynx showed no laryngeal penetration or aspiration. 12.5 mm diameter barium tablet passed from oral cavity to stomach without obstruction/ delay. IMPRESSION: Age-related esophageal dysmotility. Otherwise negative exam. Electronically Signed   By: Lavonia Dana M.D.   On: 01/17/2017 15:12   Ir US Guide Bx Asp/drain  Result Date: 01/22/2017 INDICATION: Malignant right pleural effusion, shortness of breath EXAM: Ultrasound guidance for needle  access Right chest tunneled pleural drain (PleurX catheter) MEDICATIONS: Ancef 2 g administered within 1 hour of the procedure ANESTHESIA/SEDATION: Fentanyl 50 mcg IV; Versed 1.5 mg IV Moderate Sedation Time:  20 minutes The patient was continuously monitored during the procedure by the interventional radiology nurse under my direct supervision. COMPLICATIONS: None immediate. PROCEDURE: Informed written consent was obtained from the patient after a thorough discussion of the procedural risks, benefits and alternatives. All questions were addressed. Maximal Sterile Barrier Technique was utilized including caps, mask, sterile gowns, sterile gloves, sterile drape, hand hygiene and skin antiseptic. A timeout was performed prior to the initiation of the procedure. Previous imaging reviewed. Patient positioned right anterior oblique. Preliminary ultrasound performed. The large non loculated right pleural effusion was localized. A lower intercostal space in the mid axillary line was marked. Under sterile conditions and local anesthesia, percutaneous needle access performed with ultrasound. Needle position confirmed with ultrasound. Images obtained for documentation. Guidewire advanced under ultrasound and confirmed within the pleural space. In the adjacent chest soft tissues, a subcutaneous tunnel was created under sterile conditions and local anesthesia. The PleurX catheter  was tunneled subcutaneously to the puncture site and advanced into the right pleural space through a valved peel-away sheath. Position confirmed with ultrasound and fluoroscopy. Images obtained for documentation. Following insertion, a 1 L right thoracentesis was performed with the catheter. Catheter was secured with a Prolene suture. Entry site closed with subcuticular Vicryl suture and derma bond. Patient tolerated the procedure well. No immediate complication. IMPRESSION: Successful ultrasound and fluoroscopic right chest tunneled pleural drain (PleurX catheter). 1 L thoracentesis performed with the catheter after insertion. Electronically Signed   By: Jerilynn Mages.  Shick M.D.   On: 01/22/2017 12:51   US Thoracentesis Asp Pleural Space W/img Guide  Result Date: 01/16/2017 INDICATION: Symptomatic RIGHT sided pleural effusion, prior RIGHT thoracentesis EXAM: US THORACENTESIS ASP PLEURAL SPACE W/IMG GUIDE COMPARISON:  Previous thoracentesis MEDICATIONS: 10 cc 1% lidocaine. COMPLICATIONS: None immediate. TECHNIQUE: Informed written consent was obtained from the patient after a discussion of the risks, benefits and alternatives to treatment. A timeout was performed prior to the initiation of the procedure. Initial ultrasound scanning demonstrates a right pleural effusion. The lower chest was prepped and draped in the usual sterile fashion. 1% lidocaine was used for local anesthesia. Under direct ultrasound guidance, a 19 gauge, 7-cm, Yueh catheter was introduced. An ultrasound image was saved for documentation purposes. The thoracentesis was performed. The catheter was removed and a dressing was applied. The patient tolerated the procedure well without immediate post procedural complication. The patient was escorted to have an upright chest radiograph. FINDINGS: A total of approximately 1 liter of yellow fluid was removed. Requested samples were sent to the laboratory. IMPRESSION: Successful ultrasound-guided R sided  thoracentesis yielding 1 liter of pleural fluid. Post procedure CXR:  No PTX per Radiologist Read by Lavonia Drafts [facility] Electronically Signed   By: Lavonia Dana M.D.   On: 01/16/2017 11:14   US Thoracentesis Asp Pleural Space W/img Guide  Result Date: 01/14/2017 INDICATION: RIGHT pleural effusion EXAM: ULTRASOUND GUIDED DIAGNOSTIC AND THERAPEUTIC RIGHT THORACENTESIS MEDICATIONS: None. COMPLICATIONS: None immediate. PROCEDURE: Procedure, benefits, and risks of procedure were discussed with patient. Written informed consent for procedure was obtained. Time out protocol followed. Pleural effusion localized by ultrasound at the posterior RIGHT hemithorax. Skin prepped and draped in usual sterile fashion. Skin and soft tissues anesthetized with 9 mL of 1% lidocaine. 8 French thoracentesis catheter placed into the RIGHT pleural space. 1.5 L  of clear yellow RIGHT pleural fluid aspirated by syringe pump. Procedure tolerated well by patient without immediate complication. FINDINGS: A total of approximately 1.5 L of RIGHT pleural fluid was removed. Samples were sent to laboratory for requested analysis. IMPRESSION: Successful ultrasound guided RIGHT thoracentesis yielding 1.5 L of pleural fluid. Electronically Signed   By: Lavonia Dana M.D.   On: 01/14/2017 14:20     10/22, ultrasound-guided right-sided thoracentesis with removal of 1.5 L of fluid 10/24, ultrasound-guided right-sided thoracentesis with removal of 1 L of fluid 10/30, placement of right-sided Pleurx catheter and removal of 1 L of fluid  Subjective: Shortness of breath is improving. No productive cough  Discharge Exam: Vitals:   01/23/17 0652 01/23/17 1257  BP:    Pulse: 79   Resp:    Temp:    SpO2: 97% 96%   Vitals:   01/23/17 0500 01/23/17 0650 01/23/17 0652 01/23/17 1257  BP: (!) 119/52     Pulse: 86 84 79   Resp: 16     Temp: 98.1 F (36.7 C)     TempSrc: Oral     SpO2: 98% 91% 97% 96%  Weight:      Height:         General: Pt is alert, awake, not in acute distress Cardiovascular: RRR, S1/S2 +, no rubs, no gallops Respiratory: diminished breath sounds at bases Abdominal: Soft, NT, ND, bowel sounds + Extremities: 1+ edema, no cyanosis    The results of significant diagnostics from this hospitalization (including imaging, microbiology, ancillary and laboratory) are listed below for reference.     Microbiology: Recent Results (from the past 240 hour(s))  Acid Fast Smear (AFB)     Status: None   Collection Time: 01/14/17 12:50 PM  Result Value Ref Range Status   AFB Specimen Processing Concentration  Final   Acid Fast Smear Negative  Final    Comment: (NOTE) Performed At: [facility] [address] [place], [place] [id number] Lindon Romp MD [id number]    Source (AFB) PLEURAL  Final  Culture, body fluid-bottle     Status: None   Collection Time: 01/14/17 12:50 PM  Result Value Ref Range Status   Specimen Description PLEURAL COLLECTED BY DOCTOR  Final   Special Requests BOTTLES DRAWN AEROBIC AND ANAEROBIC 10 CC EACH  Final   Culture NO GROWTH 5 DAYS  Final   Report Status 01/19/2017 FINAL  Final  Gram stain     Status: None   Collection Time: 01/14/17 12:50 PM  Result Value Ref Range Status   Specimen Description PLEURAL  Final   Special Requests NONE  Final   Gram Stain   Final    CYTOSPIN SMEAR NO ORGANISMS SEEN WBC SEEN WBC PRESENT, PREDOMINANTLY MONONUCLEAR    Report Status 01/14/2017 FINAL  Final     Labs: BNP (last 3 results) No results for input(s): BNP in the last 8760 hours. Basic Metabolic Panel:  Recent Labs Lab 01/17/17 0407 01/19/17 0600  NA 133* 133*  K 3.3* 3.2*  CL 98* 99*  CO2 28 27  GLUCOSE 111* 122*  BUN 21* 22*  CREATININE 1.37* 1.24  CALCIUM 8.1* 8.1*  MG 1.4*  --   PHOS 3.9  --    Liver Function Tests: No results for input(s): AST, ALT, ALKPHOS, BILITOT, PROT, ALBUMIN in the last 168 hours. No results for input(s): LIPASE,  AMYLASE in the last 168 hours. No results for input(s): AMMONIA in the last 168 hours. CBC:  Recent  Labs Lab 01/17/17 0407  WBC 5.1  NEUTROABS 2.8  HGB 10.3*  HCT 32.6*  MCV 76.5*  PLT 211   Cardiac Enzymes: No results for input(s): CKTOTAL, CKMB, CKMBINDEX, TROPONINI in the last 168 hours. BNP: Invalid input(s): POCBNP CBG:  Recent Labs Lab 01/22/17 0742 01/22/17 1651 01/22/17 2140 01/23/17 0719 01/23/17 1113  GLUCAP 129* 141* 156* 133* 195*   D-Dimer No results for input(s): DDIMER in the last 72 hours. Hgb A1c No results for input(s): HGBA1C in the last 72 hours. Lipid Profile No results for input(s): CHOL, HDL, LDLCALC, TRIG, CHOLHDL, LDLDIRECT in the last 72 hours. Thyroid function studies No results for input(s): TSH, T4TOTAL, T3FREE, THYROIDAB in the last 72 hours.  Invalid input(s): FREET3 Anemia work up No results for input(s): VITAMINB12, FOLATE, FERRITIN, TIBC, IRON, RETICCTPCT in the last 72 hours. Urinalysis    Component Value Date/Time   COLORURINE YELLOW 01/17/2017 1625   APPEARANCEUR CLOUDY (A) 01/17/2017 1625   LABSPEC 1.019 01/17/2017 1625   PHURINE 6.0 01/17/2017 1625   GLUCOSEU NEGATIVE 01/17/2017 1625   HGBUR NEGATIVE 01/17/2017 1625   BILIRUBINUR NEGATIVE 01/17/2017 1625   KETONESUR 5 (A) 01/17/2017 1625   PROTEINUR 100 (A) 01/17/2017 1625   NITRITE NEGATIVE 01/17/2017 1625   LEUKOCYTESUR SMALL (A) 01/17/2017 1625   Sepsis Labs Invalid input(s): PROCALCITONIN,  WBC,  LACTICIDVEN Microbiology Recent Results (from the past 240 hour(s))  Acid Fast Smear (AFB)     Status: None   Collection Time: 01/14/17 12:50 PM  Result Value Ref Range Status   AFB Specimen Processing Concentration  Final   Acid Fast Smear Negative  Final    Comment: (NOTE) Performed At: [facility] [address] [place], [place] [id number] Lindon Romp MD [id number]    Source (AFB) PLEURAL  Final  Culture, body fluid-bottle     Status: None    Collection Time: 01/14/17 12:50 PM  Result Value Ref Range Status   Specimen Description PLEURAL COLLECTED BY DOCTOR  Final   Special Requests BOTTLES DRAWN AEROBIC AND ANAEROBIC 10 CC EACH  Final   Culture NO GROWTH 5 DAYS  Final   Report Status 01/19/2017 FINAL  Final  Gram stain     Status: None   Collection Time: 01/14/17 12:50 PM  Result Value Ref Range Status   Specimen Description PLEURAL  Final   Special Requests NONE  Final   Gram Stain   Final    CYTOSPIN SMEAR NO ORGANISMS SEEN WBC SEEN WBC PRESENT, PREDOMINANTLY MONONUCLEAR    Report Status 01/14/2017 FINAL  Final     Time coordinating discharge: Over 30 minutes  SIGNED:   Kathie Dike, MD  [facility] 01/23/2017, 2:43 PM Pager   If 7PM-7AM, please contact night-coverage [email or web address] Password [email or web address]

## 2017-02-23 DEATH — deceased

## 2017-02-28 LAB — ACID FAST CULTURE WITH REFLEXED SENSITIVITIES (MYCOBACTERIA)

## 2017-02-28 LAB — ACID FAST CULTURE WITH REFLEXED SENSITIVITIES: ACID FAST CULTURE - AFSCU3: NEGATIVE

## 2018-08-18 IMAGING — RF DG ESOPHAGUS
11 of 13 series · 15 of 24 positions shown · non-contrast
Comparison: CT chest 01/15/2017

CLINICAL DATA: Abnormal CT chest exam with wall thickening, fluid
and question mass in the proximal thoracic esophagus ; patient
denies swallowing difficulties.

EXAM:
ESOPHOGRAM/BARIUM SWALLOW
TECHNIQUE: Single contrast examination was performed using thin barium. Patient
also swallowed a 12.5 mm diameter barium tablet.
FLUOROSCOPY TIME:  Fluoroscopy Time:  2 minutes 0 seconds
Radiation Exposure Index (if provided by the fluoroscopic device):
47.6 mGy
Number of Acquired Spot Images: multiple fluoroscopic screen
captures

[Series 1: cp_standard · 0.18mm/px · 1 of 81 frames shown (1 of 11)]
[frame 13/81]
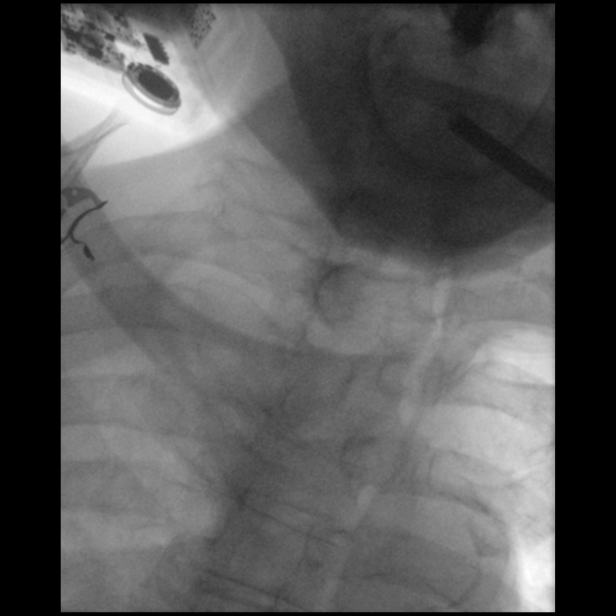

[Series 2: cp_standard · 0.18mm/px · 1 of 43 frames shown (2 of 11)]
[frame 1/43]
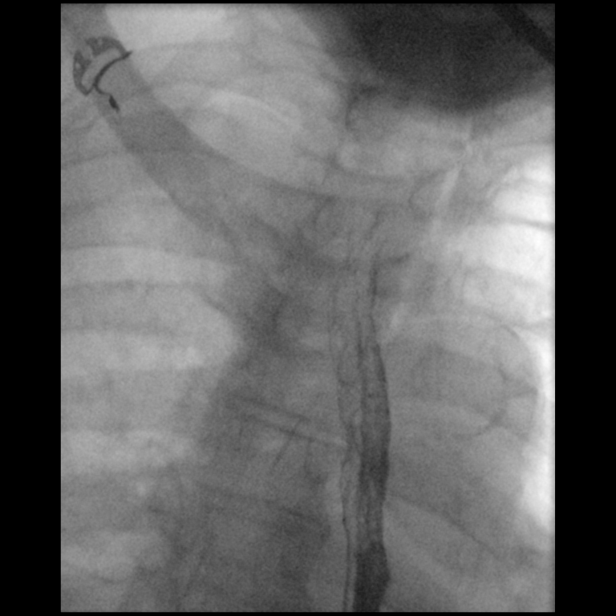

[Series 3: cp_standard · 0.18mm/px · 2 of 16 frames shown (3 of 11)]
[frame 7/16]
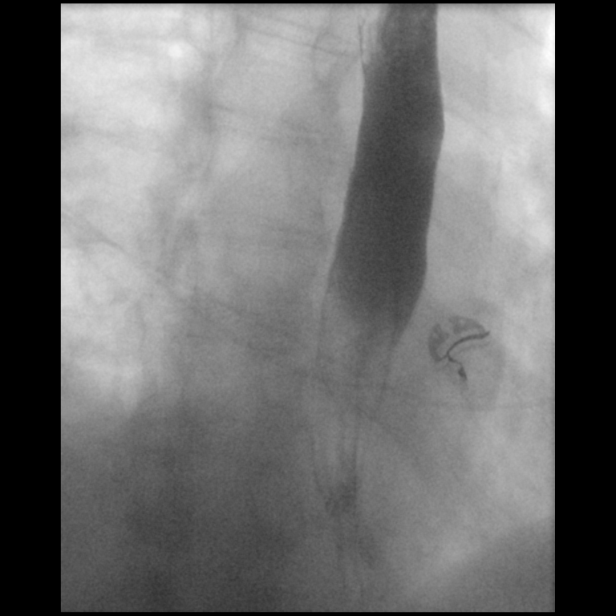
[frame 14/16]
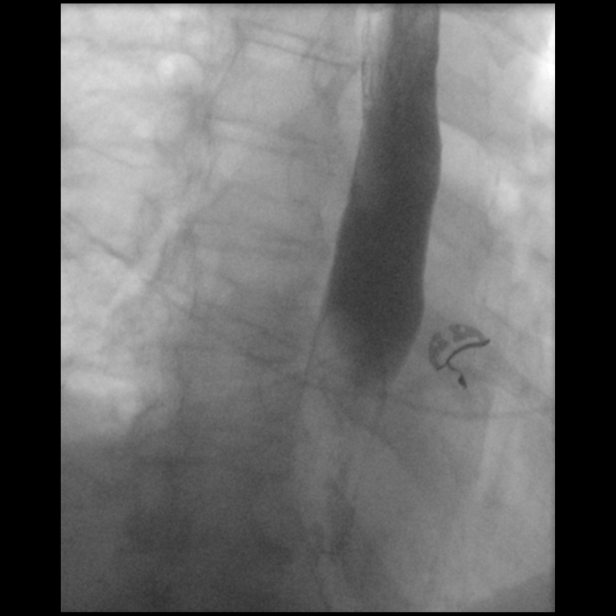

[Series 5: cp_standard · 0.18mm/px · 2 of 124 frames shown (4 of 11)]
[frame 19/124]
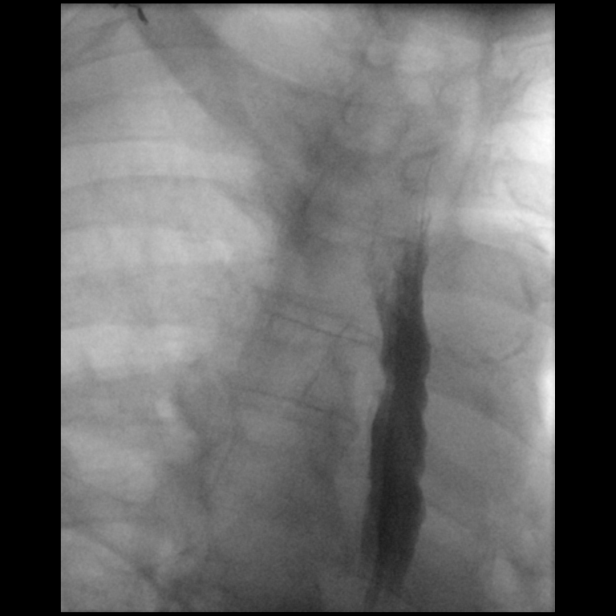
[frame 63/124]
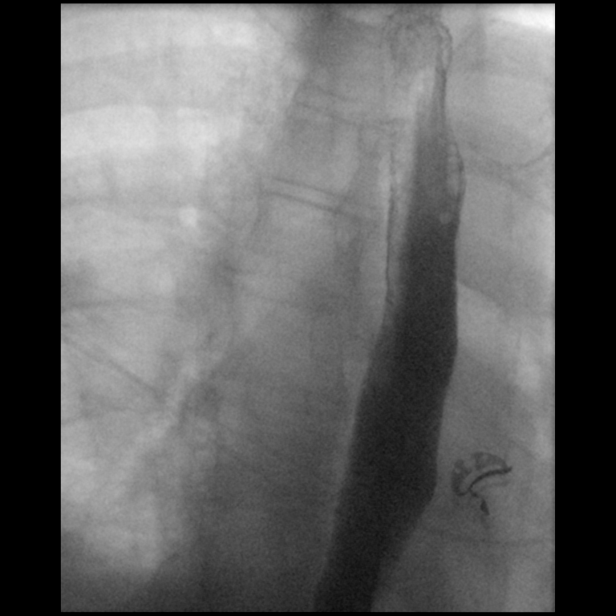

[Series 6: cp_standard · 0.18mm/px · 1 of 43 frames shown (5 of 11)]
[frame 22/43]
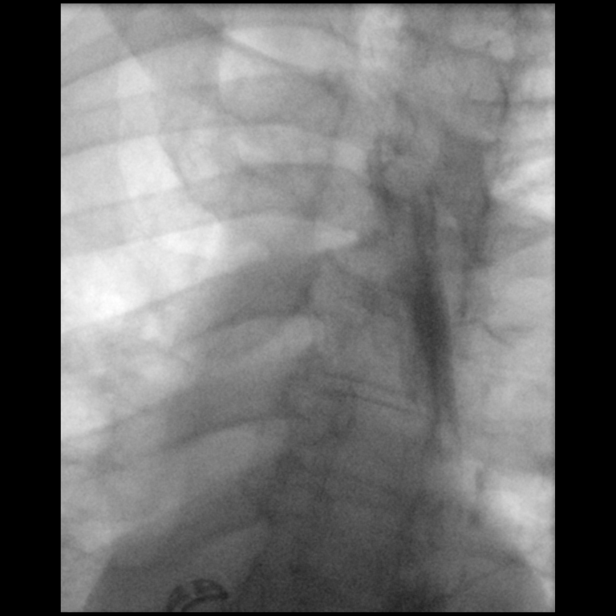

[Series 7: cp_standard · 0.18mm/px · 1 of 96 frames shown (6 of 11)]
[frame 86/96]
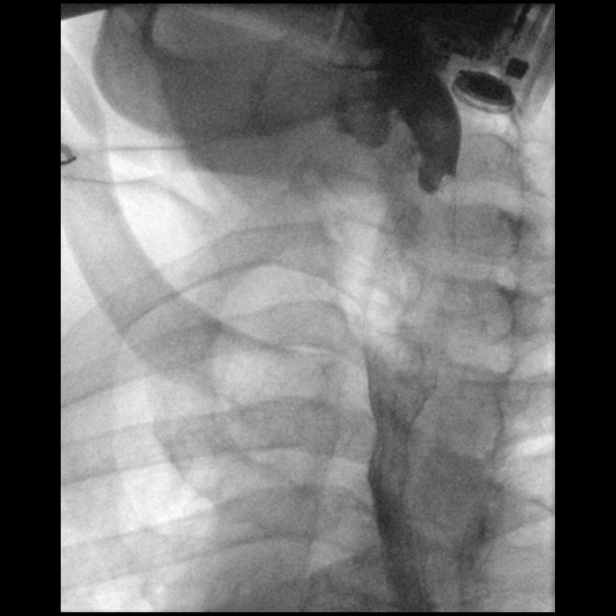

[Series 8: cp_standard · 0.18mm/px · 1 of 87 frames shown (7 of 11)]
[frame 14/87]
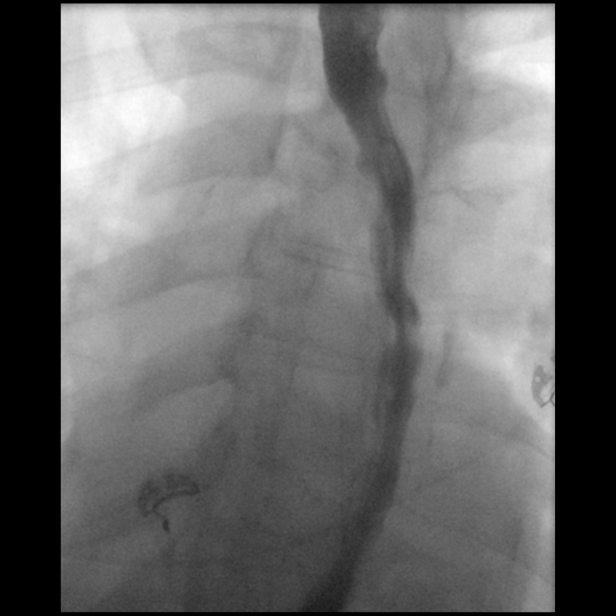

[Series 9: cp_standard · 0.18mm/px · 2 of 17 frames shown (8 of 11)]
[frame 3/17]
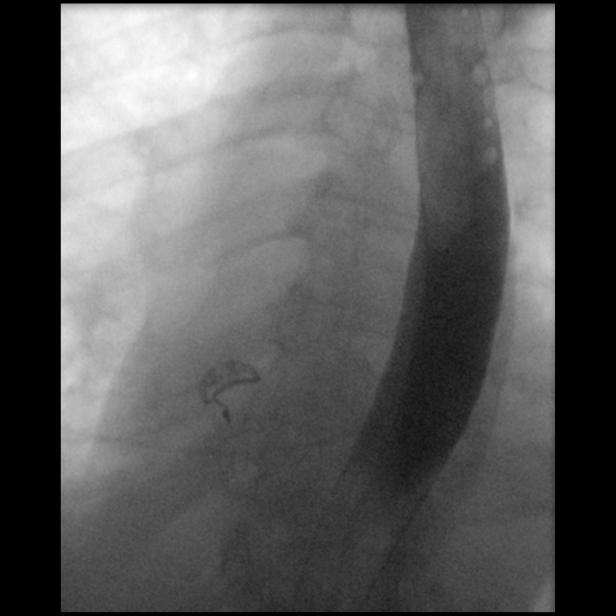
[frame 15/17]
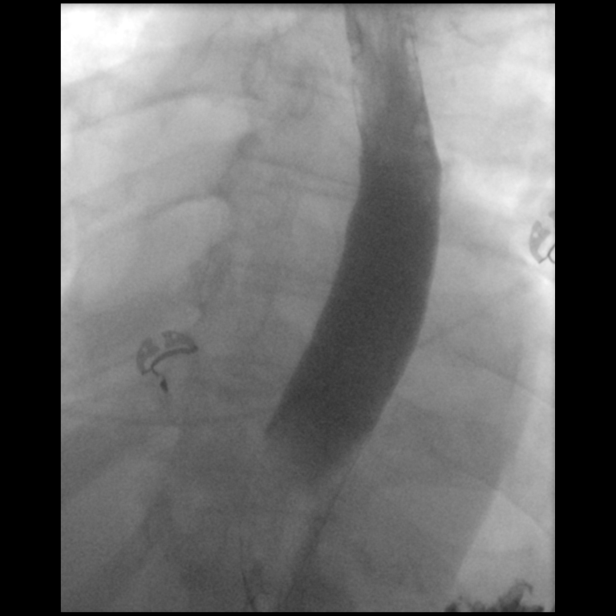

[Series 11: cp_standard · 0.18mm/px · 1 of 98 frames shown (9 of 11)]
[frame 14/98]
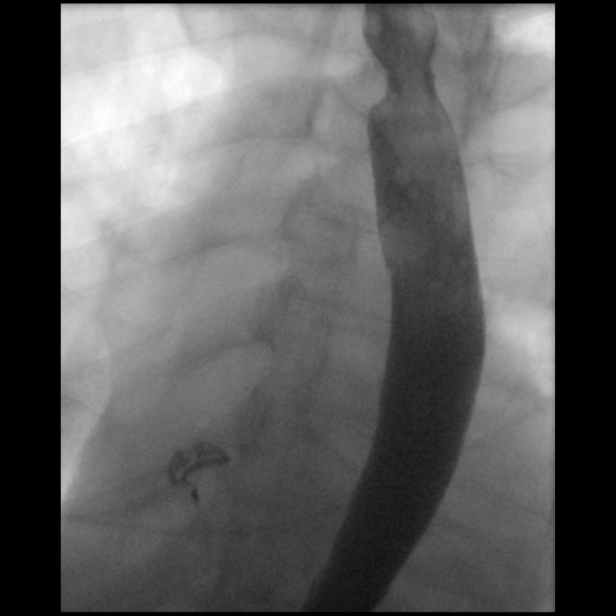

[Series 12: cp_standard · 0.28mm/px · 2 of 193 frames shown (10 of 11)]
[frame 29/193]
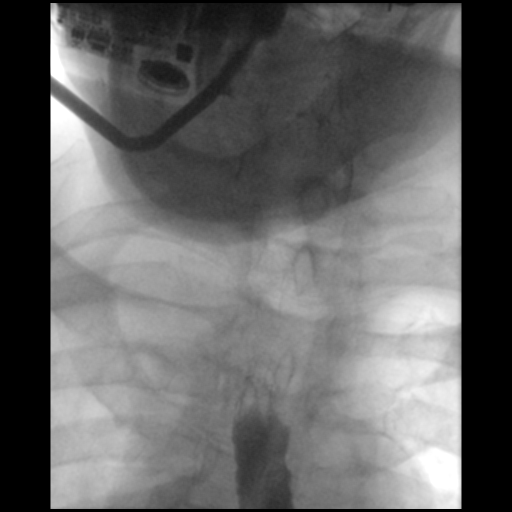
[frame 165/193]
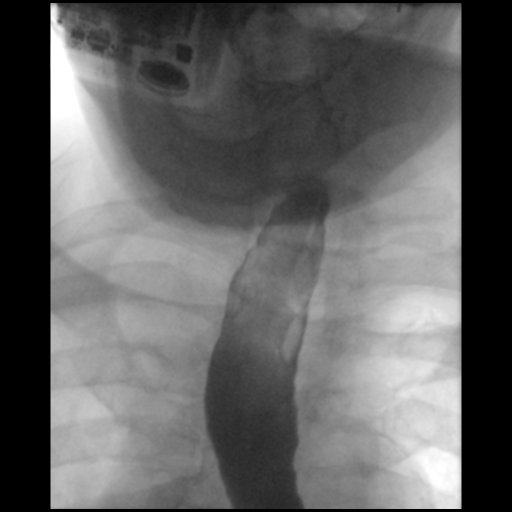

[Series 13: cp_standard · 0.28mm/px · 1 of 123 frames shown (11 of 11)]
[frame 105/123]
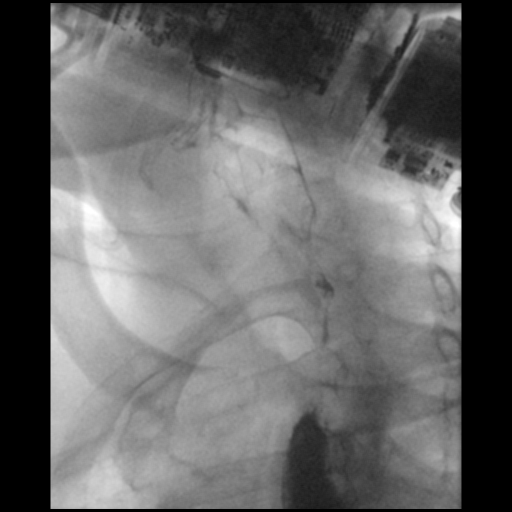

[15 of 24 positions shown; findings below may reference images not displayed]

FINDINGS: Grossly normal esophageal distention without mass or stricture.

No persistent intraluminal filling defects.

Specifically no abnormalities identified in the thoracic esophagus
at the site of questioned abnormality on CT, presumed to about an
artifact.

Diffuse age-related esophageal dysmotility.

Esophageal wall grossly smooth without irregularity or ulceration.

Targeted rapid sequence imaging of the cervical esophagus and
hypopharynx showed no laryngeal penetration or aspiration. 12.5 mm
diameter barium tablet passed from oral cavity to stomach without
obstruction/ delay.
IMPRESSION: Age-related esophageal dysmotility.

Otherwise negative exam.
